# Patient Record
Sex: Male | Born: 1965
Health system: Southern US, Community
[De-identification: ages and names within clinical notes are randomized; demographics above are authoritative.]

## PROBLEM LIST (undated history)

## (undated) DIAGNOSIS — N529 Male erectile dysfunction, unspecified: Secondary | ICD-10-CM

## (undated) DIAGNOSIS — N400 Enlarged prostate without lower urinary tract symptoms: Secondary | ICD-10-CM

## (undated) DIAGNOSIS — E785 Hyperlipidemia, unspecified: Secondary | ICD-10-CM

## (undated) DIAGNOSIS — G4730 Sleep apnea, unspecified: Secondary | ICD-10-CM

## (undated) DIAGNOSIS — T7840XA Allergy, unspecified, initial encounter: Secondary | ICD-10-CM

## (undated) HISTORY — PX: VASECTOMY: SHX75

## (undated) HISTORY — DX: Benign prostatic hyperplasia without lower urinary tract symptoms: N40.0

## (undated) HISTORY — DX: Allergy, unspecified, initial encounter: T78.40XA

---

## 2006-08-16 ENCOUNTER — Ambulatory Visit: Payer: Self-pay | Admitting: Internal Medicine

## 2012-04-14 ENCOUNTER — Emergency Department: Payer: Self-pay | Admitting: Unknown Physician Specialty

## 2012-08-05 ENCOUNTER — Emergency Department: Payer: Self-pay | Admitting: Emergency Medicine

## 2012-08-05 LAB — CBC
HCT: 45.2 % (ref 40.0–52.0)
HGB: 15.7 g/dL (ref 13.0–18.0)
MCV: 92 fL (ref 80–100)
RBC: 4.94 10*6/uL (ref 4.40–5.90)
RDW: 12.7 % (ref 11.5–14.5)
WBC: 6.5 10*3/uL (ref 3.8–10.6)

## 2012-08-05 LAB — COMPREHENSIVE METABOLIC PANEL
Albumin: 4 g/dL (ref 3.4–5.0)
Alkaline Phosphatase: 64 U/L (ref 50–136)
Anion Gap: 8 (ref 7–16)
BUN: 12 mg/dL (ref 7–18)
Calcium, Total: 8.7 mg/dL (ref 8.5–10.1)
Glucose: 86 mg/dL (ref 65–99)
Osmolality: 278 (ref 275–301)
Potassium: 3.8 mmol/L (ref 3.5–5.1)
SGOT(AST): 39 U/L — ABNORMAL HIGH (ref 15–37)
Sodium: 140 mmol/L (ref 136–145)
Total Protein: 7.1 g/dL (ref 6.4–8.2)

## 2012-08-05 LAB — TROPONIN I: Troponin-I: <0.02 ng/mL

## 2012-08-05 LAB — CK TOTAL AND CKMB (NOT AT ARMC)
CK, Total: 565 U/L — ABNORMAL HIGH (ref 35–232)
CK-MB: 2.5 ng/mL (ref 0.5–3.6)

## 2012-09-17 ENCOUNTER — Emergency Department: Payer: Self-pay | Admitting: Emergency Medicine

## 2016-04-26 DIAGNOSIS — N400 Enlarged prostate without lower urinary tract symptoms: Secondary | ICD-10-CM | POA: Diagnosis not present

## 2016-12-02 DIAGNOSIS — J301 Allergic rhinitis due to pollen: Secondary | ICD-10-CM | POA: Diagnosis not present

## 2016-12-02 DIAGNOSIS — K1121 Acute sialoadenitis: Secondary | ICD-10-CM | POA: Diagnosis not present

## 2017-01-10 DIAGNOSIS — N401 Enlarged prostate with lower urinary tract symptoms: Secondary | ICD-10-CM | POA: Diagnosis not present

## 2017-01-10 DIAGNOSIS — I119 Hypertensive heart disease without heart failure: Secondary | ICD-10-CM | POA: Diagnosis not present

## 2017-01-10 DIAGNOSIS — E782 Mixed hyperlipidemia: Secondary | ICD-10-CM | POA: Diagnosis not present

## 2017-01-10 DIAGNOSIS — N400 Enlarged prostate without lower urinary tract symptoms: Secondary | ICD-10-CM | POA: Diagnosis not present

## 2017-08-11 ENCOUNTER — Ambulatory Visit: Payer: BLUE CROSS/BLUE SHIELD | Admitting: Physician Assistant

## 2017-08-11 ENCOUNTER — Other Ambulatory Visit: Payer: Self-pay

## 2017-08-11 ENCOUNTER — Encounter: Payer: Self-pay | Admitting: Physician Assistant

## 2017-08-11 VITALS — BP 108/74 | HR 78 | Temp 98.3°F | Resp 12 | Ht 69.0 in | Wt 204.2 lb

## 2017-08-11 DIAGNOSIS — Z23 Encounter for immunization: Secondary | ICD-10-CM

## 2017-08-11 DIAGNOSIS — Z1211 Encounter for screening for malignant neoplasm of colon: Secondary | ICD-10-CM

## 2017-08-11 DIAGNOSIS — Z125 Encounter for screening for malignant neoplasm of prostate: Secondary | ICD-10-CM

## 2017-08-11 DIAGNOSIS — N400 Enlarged prostate without lower urinary tract symptoms: Secondary | ICD-10-CM

## 2017-08-11 DIAGNOSIS — N401 Enlarged prostate with lower urinary tract symptoms: Secondary | ICD-10-CM | POA: Diagnosis not present

## 2017-08-11 DIAGNOSIS — Z7689 Persons encountering health services in other specified circumstances: Secondary | ICD-10-CM | POA: Diagnosis not present

## 2017-08-11 NOTE — Patient Instructions (Signed)

## 2017-08-11 NOTE — Progress Notes (Signed)
Patient: Travis Mahoney, Male    DOB: 1966-03-09, 51 y.o.   MRN: [id number] Visit Date: 08/11/2017  Today's Provider: Trinna Post, PA-C   Chief Complaint  Patient presents with  . Establish Care   Subjective:  Travis Mahoney is a 51 y.o. male who presents today to get established with practice and his physical exam.  His previous PCP was Dr Soyla Dryer and he has retired.  Patient does not have any issues or concerns just wants to get a check up. Patient states that if he gets flu shot he will get this at his work because it is free there. Cannot remember the last time he had a tetanus shot.   He works as a Banker at [employer], works many long hours. He is single. He has a 45 year old daughter who is doing well.   He is sexually active, does not have a concern for an STI.   He has never been screened for colon cancer. He does not have a family history of colon cancer. He would like to be referred for colonoscopy. He has never been screened for prostate cancer to his knowledge. There is history of prostate cancer in a grandfather. Patient himself has an enlarged prostate and takes flomax and Cialis for this.   He does not and has never smoked. Will have a few beers a week. Does not and has never used drugs.   Otherwise, he reports no health issues.   Review of Systems  Constitutional: Negative.   HENT: Negative.   Eyes: Negative.   Respiratory: Negative.   Cardiovascular: Negative.   Gastrointestinal: Negative.   Endocrine: Negative.   Musculoskeletal: Negative.   Skin: Negative.   Neurological: Negative.   Psychiatric/Behavioral: Negative.     Social History   Socioeconomic History  . Marital status: Single    Spouse name: Not on file  . Number of children: Not on file  . Years of education: Not on file  . Highest education level: Not on file  Social Needs  . Financial resource strain: Not on file  . Food insecurity - worry: Not on file  . Food insecurity -  inability: Not on file  . Transportation needs - medical: Not on file  . Transportation needs - non-medical: Not on file  Occupational History  . Not on file  Tobacco Use  . Smoking status: Never Smoker  . Smokeless tobacco: Never Used  Substance and Sexual Activity  . Alcohol use: Yes    Alcohol/week: 2.4 oz    Types: 4 Cans of beer per week  . Drug use: No  . Sexual activity: Not on file  Other Topics Concern  . Not on file  Social History Narrative  . Not on file    There are no active problems to display for this patient.   Past Surgical History:  Procedure Laterality Date  . VASECTOMY      His family history includes Diabetes in his father; Hypertension in his mother; Other in his father.     Outpatient Encounter Medications as of 08/11/2017  Medication Sig  . CIALIS 5 MG tablet TK 1 T PO QD  . tamsulosin (FLOMAX) 0.4 MG CAPS capsule    No facility-administered encounter medications on file as of 08/11/2017.     Patient Care Team: Paulene Floor as PCP - General (Physician Assistant)      Objective:   Vitals:  Vitals:   08/11/17 1408  BP:  108/74  Pulse: 78  Resp: 12  Temp: 98.3 F (36.8 C)  Weight: 204 lb 3.2 oz (92.6 kg)  Height: 5\' 9"  (1.753 m)    Physical Exam  Constitutional: He is oriented to person, place, and time. He appears well-developed and well-nourished.  HENT:  Right Ear: Tympanic membrane and external ear normal.  Left Ear: Tympanic membrane and external ear normal.  Mouth/Throat: No oropharyngeal exudate.  Eyes: Conjunctivae are normal.  Neck: Neck supple.  Cardiovascular: Normal rate and regular rhythm.  Pulmonary/Chest: Effort normal and breath sounds normal.  Abdominal: Soft. Bowel sounds are normal. He exhibits no distension. There is no tenderness. There is no rebound.  Lymphadenopathy:    He has no cervical adenopathy.  Neurological: He is alert and oriented to person, place, and time.  Skin: Skin is warm and dry.   Psychiatric: He has a normal mood and affect. His behavior is normal.     Depression Screen PHQ 2/9 Scores 08/11/2017  PHQ - 2 Score 0  PHQ- 9 Score 0   Assessment & Plan:    1. Encounter to establish care Records requested from Dr Stephens November office. Will have him get follow up lab work after requesting these since he said he recently got labs.  2. Colon cancer screening - Ambulatory referral to Gastroenterology  3. Prostate cancer screening  4. Enlarged prostate  Will refill flomax and cialis.   5. Benign prostatic hyperplasia with lower urinary tract symptoms, symptom details unspecified   6. Need for Tdap vaccination Administered today. - Tdap vaccine greater than or equal to 7yo IM  Return in about 1 year (around 08/11/2018) for CPE.  The entirety of the information documented in the History of Present Illness, Review of Systems and Physical Exam were personally obtained by me. Portions of this information were initially documented by [facility], CMA and reviewed by me for thoroughness and accuracy.

## 2017-08-11 NOTE — Progress Notes (Deleted)
   Travis Mahoney  MRN: [id number] DOB: December 11, 1965  Subjective:  HPI  Patient is here to get established. His previous PCP was Dr Soyla Dryer and he has retired. Patient does not have any issues or concerns just wants to get a check up.  There are no active problems to display for this patient.   Past Medical History:  Diagnosis Date  . Allergy   . Enlarged prostate     Social History   Socioeconomic History  . Marital status: Married    Spouse name: Not on file  . Number of children: Not on file  . Years of education: Not on file  . Highest education level: Not on file  Social Needs  . Financial resource strain: Not on file  . Food insecurity - worry: Not on file  . Food insecurity - inability: Not on file  . Transportation needs - medical: Not on file  . Transportation needs - non-medical: Not on file  Occupational History  . Not on file  Tobacco Use  . Smoking status: Never Smoker  . Smokeless tobacco: Never Used  Substance and Sexual Activity  . Alcohol use: Yes    Alcohol/week: 2.4 oz    Types: 4 Cans of beer per week  . Drug use: No  . Sexual activity: Not on file  Other Topics Concern  . Not on file  Social History Narrative  . Not on file    Outpatient Encounter Medications as of 08/11/2017  Medication Sig  . CIALIS 5 MG tablet TK 1 T PO QD  . tamsulosin (FLOMAX) 0.4 MG CAPS capsule    No facility-administered encounter medications on file as of 08/11/2017.     Allergies  Allergen Reactions  . Penicillins Itching and Swelling    ROS  Objective:  BP 108/74   Pulse 78   Temp 98.3 F (36.8 C)   Resp 12   Ht 5\' 9"  (1.753 m)   Wt 204 lb 3.2 oz (92.6 kg)   BMI 30.16 kg/m   Physical Exam  Assessment and Plan :  No diagnosis found.

## 2017-08-31 ENCOUNTER — Ambulatory Visit: Payer: BLUE CROSS/BLUE SHIELD | Admitting: Physician Assistant

## 2017-08-31 ENCOUNTER — Encounter: Payer: Self-pay | Admitting: Physician Assistant

## 2017-08-31 VITALS — BP 108/64 | HR 88 | Temp 99.1°F | Resp 16 | Wt 210.0 lb

## 2017-08-31 DIAGNOSIS — Z1211 Encounter for screening for malignant neoplasm of colon: Secondary | ICD-10-CM

## 2017-08-31 DIAGNOSIS — R29818 Other symptoms and signs involving the nervous system: Secondary | ICD-10-CM

## 2017-08-31 DIAGNOSIS — R1032 Left lower quadrant pain: Secondary | ICD-10-CM

## 2017-08-31 NOTE — Patient Instructions (Signed)
Hernia A hernia happens when an organ or tissue inside your body pushes out through a weak spot in the belly (abdomen). Follow these instructions at home:  Avoid stretching or overusing (straining) the muscles near the hernia.  Do not lift anything heavier than 10 lb (4.5 kg).  Use the muscles in your leg when you lift something up. Do not use the muscles in your back.  When you cough, try to cough gently.  Eat a diet that has a lot of fiber. Eat lots of fruits and vegetables.  Drink enough fluids to keep your pee (urine) clear or pale yellow. Try to drink 6-8 glasses of water a day.  Take medicines to make your poop soft (stool softeners) as told by your doctor.  Lose weight, if you are overweight.  Do not use any tobacco products, including cigarettes, chewing tobacco, or electronic cigarettes. If you need help quitting, ask your doctor.  Keep all follow-up visits as told by your doctor. This is important. Contact a doctor if:  The skin by the hernia gets puffy (swollen) or red.  The hernia is painful. Get help right away if:  You have a fever.  You have belly pain that is getting worse.  You feel sick to your stomach (nauseous) or you throw up (vomit).  You cannot push the hernia back in place by gently pressing on it while you are lying down.  The hernia: ? Changes in shape or size. ? Is stuck outside your belly. ? Changes color. ? Feels hard or tender. This information is not intended to replace advice given to you by your health care provider. Make sure you discuss any questions you have with your health care provider. Document Released: 03/09/2010 Document Revised: 02/25/2016 Document Reviewed: 07/30/2014 Elsevier Interactive Patient Education  2018 Elsevier Inc.  

## 2017-08-31 NOTE — Progress Notes (Signed)
Patient: Travis Mahoney Male    DOB: 02/28/1966   51 y.o.   MRN: [id number] Visit Date: 08/31/2017  Today's Provider: Trinna Post, PA-C   Chief Complaint  Patient presents with  . Abdominal Pain    Left side; has been going on for months.    . Sleep Apnea   Subjective:    Travis Mahoney is a 51 y/o man who presents with LLQ abdominal pain ongoing for several months and also concern for sleep apnea.   Reports a dull pain in his abdomen that increases with weight lifting activities and abdominal exercises. Sometimes, he notices a protrusion from this area. Pertinent info below.  Abdominal Pain  This is a chronic problem. The current episode started more than 1 month ago. The pain is located in the LLQ. The quality of the pain is dull. The abdominal pain does not radiate. Associated symptoms include constipation. Pertinent negatives include no anorexia, arthralgias, belching, diarrhea, dysuria, fever, flatus, frequency, headaches, hematochezia, hematuria, melena, myalgias, nausea, vomiting or weight loss. Exacerbated by: abdominal exercises. The pain is relieved by nothing.    Sleep Apnea: Patient presents with possible obstructive sleep apnea. Patent has a 1 year history of symptoms of daytime fatigue and morning fatigue. Patient generally gets 4 hours of sleep per night, and states they generally have difficulty falling asleep, nightime awakenings and difficulty falling back asleep if awakened. Snoring of mild severity is present. Apneic episodes is present. Nasal obstruction is not present.  Patient has not had tonsillectomy.      Allergies  Allergen Reactions  . Penicillins Itching and Swelling     Current Outpatient Medications:  .  CIALIS 5 MG tablet, TK 1 T PO QD, Disp: , Rfl: 3 .  tamsulosin (FLOMAX) 0.4 MG CAPS capsule, , Disp: , Rfl: 0  Review of Systems  Constitutional: Positive for fatigue. Negative for activity change, appetite change, chills,  diaphoresis, fever, unexpected weight change and weight loss.  HENT: Negative for congestion.   Respiratory: Positive for apnea. Negative for cough, choking, chest tightness, shortness of breath, wheezing and stridor.   Cardiovascular: Negative.   Gastrointestinal: Positive for abdominal pain and constipation. Negative for abdominal distention, anal bleeding, anorexia, blood in stool, diarrhea, flatus, hematochezia, melena, nausea, rectal pain and vomiting.  Genitourinary: Negative for dysuria, frequency and hematuria.  Musculoskeletal: Negative for arthralgias and myalgias.  Neurological: Negative for headaches.    Social History   Tobacco Use  . Smoking status: Never Smoker  . Smokeless tobacco: Never Used  Substance Use Topics  . Alcohol use: Yes    Alcohol/week: 2.4 oz    Types: 4 Cans of beer per week   Objective:   BP 108/64 (BP Location: Left Arm, Patient Position: Sitting, Cuff Size: Large)   Pulse 88   Temp 99.1 F (37.3 C) (Oral)   Resp 16   Wt 210 lb (95.3 kg)   BMI 31.01 kg/m  Vitals:   08/31/17 0817  BP: 108/64  Pulse: 88  Resp: 16  Temp: 99.1 F (37.3 C)  TempSrc: Oral  Weight: 210 lb (95.3 kg)     Physical Exam  Constitutional: He is oriented to person, place, and time. He appears well-developed and well-nourished.  Cardiovascular: Normal rate and regular rhythm.  Pulmonary/Chest: Effort normal.  Abdominal: Soft. Bowel sounds are normal. He exhibits no distension and no mass. There is no tenderness. There is no rebound and no guarding.  No hernia noted on provocative maneuvers.   Neurological: He is alert and oriented to person, place, and time.  Skin: Skin is warm and dry.  Psychiatric: He has a normal mood and affect. His behavior is normal.        Assessment & Plan:     1. Left lower quadrant pain  Suspect hernia, will refer to surgery for further work up. Counseled on avoiding heavy lifting and abdominal exercises, cautioned on  strangulation.   - Ambulatory referral to General Surgery  2. Suspected sleep apnea  - Home sleep test; Future  3. Colon cancer screening  Reordered, for some reason he was not contacted.   - Ambulatory referral to Gastroenterology  Return if symptoms worsen or fail to improve.  The entirety of the information documented in the History of Present Illness, Review of Systems and Physical Exam were personally obtained by me. Portions of this information were initially documented by Ashley Royalty, CMA and reviewed by me for thoroughness and accuracy.         Trinna Post, PA-C  [facility]

## 2017-09-14 ENCOUNTER — Telehealth: Payer: Self-pay | Admitting: Physician Assistant

## 2017-09-14 NOTE — Telephone Encounter (Signed)
Order for home sleep study faxed to [facility]

## 2017-09-18 DIAGNOSIS — G4733 Obstructive sleep apnea (adult) (pediatric): Secondary | ICD-10-CM | POA: Diagnosis not present

## 2017-09-18 DIAGNOSIS — R0602 Shortness of breath: Secondary | ICD-10-CM | POA: Diagnosis not present

## 2017-09-19 DIAGNOSIS — R0602 Shortness of breath: Secondary | ICD-10-CM | POA: Diagnosis not present

## 2017-09-19 DIAGNOSIS — G4733 Obstructive sleep apnea (adult) (pediatric): Secondary | ICD-10-CM | POA: Diagnosis not present

## 2017-09-28 ENCOUNTER — Telehealth: Payer: Self-pay

## 2017-09-28 ENCOUNTER — Ambulatory Visit: Payer: BLUE CROSS/BLUE SHIELD | Admitting: Surgery

## 2017-09-28 ENCOUNTER — Encounter: Payer: Self-pay | Admitting: Surgery

## 2017-09-28 DIAGNOSIS — R1084 Generalized abdominal pain: Secondary | ICD-10-CM | POA: Diagnosis not present

## 2017-09-28 NOTE — Patient Instructions (Addendum)
We have scheduled you for a CT Scan of your Abdomen and Pelvis. This has been scheduled at 2:15 at our [facility] location. Please Check-in at 2:00, 15 minutes prior to your scheduled appointment. If you need to reschedule your Scan, you may do so by calling [phone].  You will need to pick up a prep kit at least 24 hours in advance of your Scan: You may pick this up at the [place] department at [facility], or [employer].  Bring a list of medications with you to your appointment and you may have nothing to eat or drink 4 hours prior to your CT Scan.    [facility] [address] [place], [place], [place] [address] Phone: [phone]   We will see you back in office as listed below:  However if you need to be seen sooner or have any questions please give our office a call.

## 2017-09-28 NOTE — Telephone Encounter (Signed)
Call made to [facility]

## 2017-09-29 ENCOUNTER — Encounter: Payer: Self-pay | Admitting: Surgery

## 2017-09-29 NOTE — Progress Notes (Signed)
Surgical Consultation  09/29/2017  Travis Mahoney is an 51 y.o. male.   Chief Complaint  Patient presents with  . New Patient (Initial Visit)    Left lower quadrant pain referred by [name]     HPI: 51 yo seen in consultation at the request of Mrs. Terrilee Croak PA. Patient reports that over the last couple months he has been having some intermittent dull discomfort/pain on the left lower quadrant. He is very active and did a lot of sit-ups and pushups. He goes to the gym regularly and is very fit. He reports that started having some left lower quadrant discomfort and usually gets worse with some exercise and certain movements. He does notice some occasional bulging of his abdominal wall. No fevers no chills, no weight loss. No nausea or vomiting. Is able to perform more than 6 Mets of activity without any shortness of breath or chest pain.  Past Medical History:  Diagnosis Date  . Allergy   . Enlarged prostate     Past Surgical History:  Procedure Laterality Date  . VASECTOMY      Family History  Problem Relation Age of Onset  . Other Father        enlarged prostate  . Diabetes Father   . Hypertension Mother     Social History:  reports that  has never smoked. he has never used smokeless tobacco. He reports that he drinks about 2.4 oz of alcohol per week. He reports that he does not use drugs.  Allergies:  Allergies  Allergen Reactions  . Penicillins Itching and Swelling    Medications reviewed.     ROS Full ROS performed and is otherwise negative other than what is stated in the HPI    BP 124/79   Pulse 78   Temp 98.2 F (36.8 C) (Oral)   Ht 5\' 9"  (1.753 m)   Wt 94.8 kg (209 lb)   BMI 30.86 kg/m    Physical Exam  Constitutional: He is oriented to person, place, and time and well-developed, well-nourished, and in no distress. No distress.  Eyes: Right eye exhibits no discharge. Left eye exhibits no discharge. No scleral icterus.  Neck: Normal range  of motion. Neck supple. No JVD present.  Cardiovascular: Normal rate, regular rhythm, normal heart sounds and intact distal pulses.  No murmur heard. Pulmonary/Chest: Effort normal. No stridor. No respiratory distress. He has no wheezes. He has no rales.  Abdominal: Soft. He exhibits no distension and no mass. There is no tenderness. There is no rebound and no guarding.  Neurological: He is alert and oriented to person, place, and time. No cranial nerve deficit. Gait normal. GCS score is 15.  Skin: Skin is warm and dry. He is not diaphoretic.  Psychiatric: Mood, memory, affect and judgment normal.  Nursing note and vitals reviewed.   Assessment/Plan: Left lower quadrant pain although no definitive evidence of hernia on physical examination. To exclude any potential (or inguinal hernia I will order a CT scan of the abdomen and pelvis. Certainly another possibility will be a sports hernia. School skeletal pain due to the muscle injury or tendon might also be in the differential diagnosis Discussed with the patient in detail for now we'll recommend anti-inflammatories as well as eyes. Avoiding strenuous activities. 1. Generalized abdominal pain We will order - CBC with Differential; Future - Comprehensive metabolic panel; Future - CT Abdomen Pelvis W Contrast; Future A copy of the report will be sent to the referring provider  Caroleen Hamman, MD [facility] General Surgeon

## 2017-10-06 ENCOUNTER — Other Ambulatory Visit: Payer: Self-pay

## 2017-10-06 DIAGNOSIS — Z1211 Encounter for screening for malignant neoplasm of colon: Secondary | ICD-10-CM

## 2017-10-06 NOTE — Telephone Encounter (Signed)
Gastroenterology Pre-Procedure Review  Request Date:   Requesting Physician: Dr.    PATIENT REVIEW QUESTIONS: The patient responded to the following health history questions as indicated:    1. Are you having any GI issues? no 2. Do you have a personal history of Polyps? no 3. Do you have a family history of Colon Cancer or Polyps? no 4. Diabetes Mellitus? no 5. Joint replacements in the past 12 months?no 6. Major health problems in the past 3 months?no 7. Any artificial heart valves, MVP, or defibrillator?no    MEDICATIONS & ALLERGIES:    Patient reports the following regarding taking any anticoagulation/antiplatelet therapy:   Plavix, Coumadin, Eliquis, Xarelto, Lovenox, Pradaxa, Brilinta, or Effient? no Aspirin? no  Patient confirms/reports the following medications:  Current Outpatient Medications  Medication Sig Dispense Refill   CIALIS 5 MG tablet TK 1 T PO QD  3   tamsulosin (FLOMAX) 0.4 MG CAPS capsule   0   No current facility-administered medications for this visit.     Patient confirms/reports the following allergies:  Allergies  Allergen Reactions   Penicillins Itching and Swelling    No orders of the defined types were placed in this encounter.   AUTHORIZATION INFORMATION Primary Insurance: 1D#: Group #:  Secondary Insurance: 1D#: Group #:  SCHEDULE INFORMATION: Date:  Time: Location:

## 2017-10-09 ENCOUNTER — Ambulatory Visit: Payer: Self-pay

## 2017-10-09 ENCOUNTER — Ambulatory Visit
Admission: RE | Admit: 2017-10-09 | Discharge: 2017-10-09 | Disposition: A | Discharge status: Home / Self Care | Payer: BLUE CROSS/BLUE SHIELD | Source: Ambulatory Visit | Attending: Surgery | Admitting: Surgery

## 2017-10-09 ENCOUNTER — Encounter: Payer: Self-pay | Admitting: Physician Assistant

## 2017-10-09 ENCOUNTER — Telehealth: Payer: Self-pay | Admitting: Gastroenterology

## 2017-10-09 DIAGNOSIS — R1084 Generalized abdominal pain: Secondary | ICD-10-CM

## 2017-10-09 DIAGNOSIS — R1032 Left lower quadrant pain: Secondary | ICD-10-CM | POA: Diagnosis not present

## 2017-10-09 DIAGNOSIS — R109 Unspecified abdominal pain: Secondary | ICD-10-CM | POA: Diagnosis not present

## 2017-10-09 MED ORDER — IOPAMIDOL (ISOVUE-300) INJECTION 61%
100.0000 mL | Freq: Once | INTRAVENOUS | Status: AC | PRN
  Administered 2017-10-09: 100 mL via INTRAVENOUS

## 2017-10-09 NOTE — Telephone Encounter (Signed)
Gastroenterology Pre-Procedure Review  Request Date:   Requesting Physician: Dr.    PATIENT REVIEW QUESTIONS: The patient responded to the following health history questions as indicated:    1. Are you having any GI issues? no 2. Do you have a personal history of Polyps? no 3. Do you have a family history of Colon Cancer or Polyps? no 4. Diabetes Mellitus? no 5. Joint replacements in the past 12 months?no 6. Major health problems in the past 3 months?no 7. Any artificial heart valves, MVP, or defibrillator?no    MEDICATIONS & ALLERGIES:    Patient reports the following regarding taking any anticoagulation/antiplatelet therapy:   Plavix, Coumadin, Eliquis, Xarelto, Lovenox, Pradaxa, Brilinta, or Effient? no Aspirin? no  Patient confirms/reports the following medications:  Current Outpatient Medications  Medication Sig Dispense Refill   CIALIS 5 MG tablet TK 1 T PO QD  3   tamsulosin (FLOMAX) 0.4 MG CAPS capsule   0   No current facility-administered medications for this visit.     Patient confirms/reports the following allergies:  Allergies  Allergen Reactions   Penicillins Itching and Swelling    No orders of the defined types were placed in this encounter.   AUTHORIZATION INFORMATION Primary Insurance: 1D#: Group #:  Secondary Insurance: 1D#: Group #:  SCHEDULE INFORMATION: Date:  Time: Location:

## 2017-10-12 DIAGNOSIS — G4733 Obstructive sleep apnea (adult) (pediatric): Secondary | ICD-10-CM | POA: Diagnosis not present

## 2017-10-13 ENCOUNTER — Telehealth: Payer: Self-pay | Admitting: Gastroenterology

## 2017-10-13 ENCOUNTER — Telehealth: Payer: Self-pay

## 2017-10-13 NOTE — Telephone Encounter (Signed)
-----   Message from Jules Husbands, MD sent at 10/09/2017  7:27 PM EST ----- Please let the pt know that there were no hernias on CT scan, ? Bladder infection but this is not definitive. May keep f/u appt

## 2017-10-13 NOTE — Telephone Encounter (Signed)
Dr. Dahlia Byes wanted for me to contact patient to let him know that his CT Scan did not show any hernias but to continue with his follow up appointment with him. Patient agreed and had no questions at this time. Patient also has his colonoscopy scheduled on 10/20/2017 with Dr. Vicente Males.

## 2017-10-13 NOTE — Telephone Encounter (Signed)
Gastroenterology Pre-Procedure Review  Request Date:   Requesting Physician: Dr.    PATIENT REVIEW QUESTIONS: The patient responded to the following health history questions as indicated:    1. Are you having any GI issues? no 2. Do you have a personal history of Polyps? no 3. Do you have a family history of Colon Cancer or Polyps? no 4. Diabetes Mellitus? no 5. Joint replacements in the past 12 months?no 6. Major health problems in the past 3 months?no 7. Any artificial heart valves, MVP, or defibrillator?no    MEDICATIONS & ALLERGIES:    Patient reports the following regarding taking any anticoagulation/antiplatelet therapy:   Plavix, Coumadin, Eliquis, Xarelto, Lovenox, Pradaxa, Brilinta, or Effient? no Aspirin? no  Patient confirms/reports the following medications:  Current Outpatient Medications  Medication Sig Dispense Refill   CIALIS 5 MG tablet TK 1 T PO QD  3   tamsulosin (FLOMAX) 0.4 MG CAPS capsule   0   No current facility-administered medications for this visit.     Patient confirms/reports the following allergies:  Allergies  Allergen Reactions   Penicillins Itching and Swelling    No orders of the defined types were placed in this encounter.   AUTHORIZATION INFORMATION Primary Insurance: 1D#: Group #:  Secondary Insurance: 1D#: Group #:  SCHEDULE INFORMATION: Date:  Time: Location:

## 2017-10-18 ENCOUNTER — Encounter: Payer: Self-pay | Admitting: Surgery

## 2017-10-18 ENCOUNTER — Ambulatory Visit: Payer: BLUE CROSS/BLUE SHIELD | Admitting: Surgery

## 2017-10-18 VITALS — BP 118/83 | HR 95 | Temp 98.7°F | Ht 69.0 in | Wt 201.2 lb

## 2017-10-18 DIAGNOSIS — R1030 Lower abdominal pain, unspecified: Secondary | ICD-10-CM

## 2017-10-18 NOTE — Patient Instructions (Signed)
Please call our office with any questions or concerns. 

## 2017-10-18 NOTE — Progress Notes (Signed)
Outpatient Surgical Follow Up  10/18/2017  Travis Mahoney is an 52 y.o. male.   Chief Complaint  Patient presents with  . Follow-up    LLQpain    HPI: LLQ pain, CT scan personal review there is no evidence of abdominal wall defects or inguinal hernia defects. There is no evidence of any acute intra-abdominal modest. Patient reports that his symptoms have improved significantly. No fevers no chills. Tolerating diet.  Past Medical History:  Diagnosis Date  . Allergy   . Enlarged prostate     Past Surgical History:  Procedure Laterality Date  . VASECTOMY      Family History  Problem Relation Age of Onset  . Other Father        enlarged prostate  . Diabetes Father   . Hypertension Mother     Social History:  reports that  has never smoked. he has never used smokeless tobacco. He reports that he drinks about 2.4 oz of alcohol per week. He reports that he does not use drugs.  Allergies:  Allergies  Allergen Reactions  . Penicillins Itching and Swelling    Medications reviewed.    ROS Full ROS performed and is otherwise negative other than what is stated in HPI   BP 118/83   Pulse 95   Temp 98.7 F (37.1 C) (Oral)   Ht 5\' 9"  (1.753 m)   Wt 91.3 kg (201 lb 3.2 oz)   BMI 29.71 kg/m    Physical Exam  Constitutional: He is oriented to person, place, and time and well-developed, well-nourished, and in no distress. No distress.  Eyes: No scleral icterus.  Neck: No JVD present.  Pulmonary/Chest: Effort normal. No stridor. No respiratory distress.  Abdominal: Soft. He exhibits no distension. There is no tenderness. There is no rebound and no guarding.  Musculoskeletal: Normal range of motion.  Neurological: He is alert and oriented to person, place, and time. Gait normal. GCS score is 15.  Skin: Skin is warm and dry.  Psychiatric: Mood, memory, affect and judgment normal.  Nursing note and vitals reviewed.   Assessment/Plan: Abdominal pain resolving , finding  was closed in origin. No need for any surgical intervention. Follow-up when necessary  Caroleen Hamman, MD [facility] General Surgeon

## 2017-10-19 ENCOUNTER — Encounter: Payer: Self-pay | Admitting: *Deleted

## 2017-10-20 ENCOUNTER — Encounter
Admission: RE | Disposition: A | Discharge status: Home / Self Care | Payer: Self-pay | Source: Ambulatory Visit | Attending: Gastroenterology

## 2017-10-20 ENCOUNTER — Other Ambulatory Visit: Payer: Self-pay

## 2017-10-20 ENCOUNTER — Ambulatory Visit
Admission: RE | Admit: 2017-10-20 | Discharge: 2017-10-20 | Disposition: A | Discharge status: Home / Self Care | Payer: BLUE CROSS/BLUE SHIELD | Source: Ambulatory Visit | Attending: Gastroenterology | Admitting: Gastroenterology

## 2017-10-20 ENCOUNTER — Encounter: Payer: Self-pay | Admitting: Student

## 2017-10-20 ENCOUNTER — Ambulatory Visit: Payer: BLUE CROSS/BLUE SHIELD | Admitting: Certified Registered"

## 2017-10-20 DIAGNOSIS — Z1211 Encounter for screening for malignant neoplasm of colon: Secondary | ICD-10-CM | POA: Insufficient documentation

## 2017-10-20 DIAGNOSIS — N529 Male erectile dysfunction, unspecified: Secondary | ICD-10-CM | POA: Diagnosis not present

## 2017-10-20 DIAGNOSIS — G4730 Sleep apnea, unspecified: Secondary | ICD-10-CM | POA: Diagnosis not present

## 2017-10-20 DIAGNOSIS — D123 Benign neoplasm of transverse colon: Secondary | ICD-10-CM | POA: Diagnosis not present

## 2017-10-20 DIAGNOSIS — Z9989 Dependence on other enabling machines and devices: Secondary | ICD-10-CM | POA: Diagnosis not present

## 2017-10-20 DIAGNOSIS — N400 Enlarged prostate without lower urinary tract symptoms: Secondary | ICD-10-CM | POA: Diagnosis not present

## 2017-10-20 DIAGNOSIS — K635 Polyp of colon: Secondary | ICD-10-CM | POA: Diagnosis not present

## 2017-10-20 DIAGNOSIS — K621 Rectal polyp: Secondary | ICD-10-CM | POA: Diagnosis not present

## 2017-10-20 DIAGNOSIS — K6289 Other specified diseases of anus and rectum: Secondary | ICD-10-CM | POA: Insufficient documentation

## 2017-10-20 HISTORY — DX: Sleep apnea, unspecified: G47.30

## 2017-10-20 HISTORY — PX: COLONOSCOPY WITH PROPOFOL: SHX5780

## 2017-10-20 HISTORY — DX: Male erectile dysfunction, unspecified: N52.9

## 2017-10-20 SURGERY — COLONOSCOPY WITH PROPOFOL
Anesthesia: General

## 2017-10-20 MED ORDER — LIDOCAINE 2% (20 MG/ML) 5 ML SYRINGE
INTRAMUSCULAR | Status: DC | PRN
  Administered 2017-10-20: 25 mg via INTRAVENOUS

## 2017-10-20 MED ORDER — PROPOFOL 10 MG/ML IV BOLUS
INTRAVENOUS | Status: DC | PRN
  Administered 2017-10-20: 70 mg via INTRAVENOUS

## 2017-10-20 MED ORDER — MIDAZOLAM HCL 5 MG/5ML IJ SOLN
INTRAMUSCULAR | Status: DC | PRN
  Administered 2017-10-20: 2 mg via INTRAVENOUS

## 2017-10-20 MED ORDER — MIDAZOLAM HCL 2 MG/2ML IJ SOLN
INTRAMUSCULAR | Status: AC
  Filled 2017-10-20: qty 2

## 2017-10-20 MED ORDER — PROPOFOL 500 MG/50ML IV EMUL
INTRAVENOUS | Status: AC
  Filled 2017-10-20: qty 50

## 2017-10-20 MED ORDER — PROPOFOL 500 MG/50ML IV EMUL
INTRAVENOUS | Status: DC | PRN
  Administered 2017-10-20: 120 ug/kg/min via INTRAVENOUS

## 2017-10-20 MED ORDER — SODIUM CHLORIDE 0.9 % IV SOLN
INTRAVENOUS | Status: DC | PRN
  Administered 2017-10-20: 10:00:00 via INTRAVENOUS

## 2017-10-20 NOTE — Op Note (Signed)
[facility]astroenterology Patient Name: Travis Mahoney Procedure Date: 10/20/2017 9:16 AM MRN: [id number] Account #: [id number] Date of Birth: 1966-08-05 Admit Type: Outpatient Age: 52 Room: [facility] ENDO ROOM 4 Gender: Male Note Status: Finalized Procedure:            Colonoscopy Indications:          Screening for colorectal malignant neoplasm Providers:            Jonathon Bellows MD, MD Referring MD:         [name] Medicines:            Monitored Anesthesia Care Complications:        No immediate complications. Procedure:            Pre-Anesthesia Assessment:                       - Prior to the procedure, a History and Physical was                        performed, and patient medications, allergies and                        sensitivities were reviewed. The patient's tolerance of                        previous anesthesia was reviewed.                       - The risks and benefits of the procedure and the                        sedation options and risks were discussed with the                        patient. All questions were answered and informed                        consent was obtained.                       - ASA Grade Assessment: II - A patient with mild                        systemic disease.                       After obtaining informed consent, the colonoscope was                        passed under direct vision. Throughout the procedure,                        the patient's blood pressure, pulse, and oxygen                        saturations were monitored continuously. The                        Colonoscope was introduced through the anus and                        advanced to  the the cecum, identified by the                        appendiceal orifice, IC valve and transillumination.                        The colonoscopy was performed with ease. The patient                        tolerated the procedure well. The quality of the bowel                       preparation was good. Findings:      The perianal and digital rectal examinations were normal.      Two sessile polyps were found in the rectum and transverse colon. The       polyps were 4 to 5 mm in size. These polyps were removed with a cold       biopsy forceps. Resection and retrieval were complete.      The exam was otherwise without abnormality on direct and retroflexion       views. Impression:           - Two 4 to 5 mm polyps in the rectum and in the                        transverse colon, removed with a cold biopsy forceps.                        Resected and retrieved.                       - The examination was otherwise normal on direct and                        retroflexion views. Recommendation:       - Discharge patient to home (with escort).                       - Resume previous diet.                       - Continue present medications.                       - Await pathology results.                       - Repeat colonoscopy in 5-10 years for surveillance                        based on pathology results. Procedure Code(s):    --- Professional ---                       [phone], Colonoscopy, flexible; with biopsy, single or                        multiple Diagnosis Code(s):    --- Professional ---                       Z12.11, Encounter for screening for malignant neoplasm  of colon                       K62.1, Rectal polyp                       D12.3, Benign neoplasm of transverse colon (hepatic                        flexure or splenic flexure) CPT copyright 2016 American Medical Association. All rights reserved. The codes documented in this report are preliminary and upon coder review may  be revised to meet current compliance requirements. Jonathon Bellows, MD Jonathon Bellows MD, MD 10/20/2017 10:25:30 AM This report has been signed electronically. Number of Addenda: 0 Note Initiated On: 10/20/2017 9:16 AM Scope Withdrawal  Time: 0 hours 12 minutes 50 seconds  Total Procedure Duration: 0 hours 14 minutes 40 seconds       [facility]

## 2017-10-20 NOTE — Transfer of Care (Signed)
Immediate Anesthesia Transfer of Care Note  Patient: Travis Mahoney  Procedure(s) Performed: COLONOSCOPY WITH PROPOFOL (N/A )  Patient Location: Endoscopy Unit  Anesthesia Type:General  Level of Consciousness: awake  Airway & Oxygen Therapy: Patient Spontanous Breathing and Patient connected to nasal cannula oxygen  Post-op Assessment: Report given to RN and Post -op Vital signs reviewed and stable  Post vital signs: Reviewed  Last Vitals:  Vitals:   10/20/17 0817 10/20/17 1028  BP: 123/82 102/67  Pulse: (!) 103 83  Resp: 16 16  Temp: 36.9 C (!) 36.3 C  SpO2: 99% 99%    Last Pain:  Vitals:   10/20/17 0817  TempSrc: Tympanic         Complications: No apparent anesthesia complications

## 2017-10-20 NOTE — Anesthesia Post-op Follow-up Note (Signed)
Anesthesia QCDR form completed.        

## 2017-10-20 NOTE — Anesthesia Preprocedure Evaluation (Signed)
Anesthesia Evaluation  Patient identified by MRN, date of birth, ID band Patient awake    Reviewed: Allergy & Precautions, H&P , NPO status , Patient's Chart, lab work & pertinent test results  History of Anesthesia Complications Negative for: history of anesthetic complications  Airway Mallampati: II  TM Distance: >3 FB Neck ROM: full    Dental  (+) Chipped   Pulmonary neg shortness of breath, sleep apnea and Continuous Positive Airway Pressure Ventilation ,           Cardiovascular Exercise Tolerance: Good (-) angina(-) Past MI and (-) DOE negative cardio ROS       Neuro/Psych negative neurological ROS  negative psych ROS   GI/Hepatic negative GI ROS, Neg liver ROS, neg GERD  ,  Endo/Other  negative endocrine ROS  Renal/GU negative Renal ROS  negative genitourinary   Musculoskeletal   Abdominal   Peds  Hematology negative hematology ROS (+)   Anesthesia Other Findings Past Medical History: No date: Allergy No date: ED (erectile dysfunction) No date: Enlarged prostate No date: Sleep apnea  Past Surgical History: No date: VASECTOMY  BMI    Body Mass Index:  29.98 kg/m      Reproductive/Obstetrics negative OB ROS                             Anesthesia Physical Anesthesia Plan  ASA: III  Anesthesia Plan: General   Post-op Pain Management:    Induction: Intravenous  PONV Risk Score and Plan: Propofol infusion  Airway Management Planned: Natural Airway and Nasal Cannula  Additional Equipment:   Intra-op Plan:   Post-operative Plan:   Informed Consent: I have reviewed the patients History and Physical, chart, labs and discussed the procedure including the risks, benefits and alternatives for the proposed anesthesia with the patient or authorized representative who has indicated his/her understanding and acceptance.   Dental Advisory Given  Plan Discussed with:  Anesthesiologist, CRNA and Surgeon  Anesthesia Plan Comments: (Patient consented for risks of anesthesia including but not limited to:  - adverse reactions to medications - risk of intubation if required - damage to teeth, lips or other oral mucosa - sore throat or hoarseness - Damage to heart, brain, lungs or loss of life  Patient voiced understanding.)        Anesthesia Quick Evaluation

## 2017-10-20 NOTE — Anesthesia Postprocedure Evaluation (Signed)
Anesthesia Post Note  Patient: Travis Mahoney  Procedure(s) Performed: COLONOSCOPY WITH PROPOFOL (N/A )  Patient location during evaluation: Endoscopy Anesthesia Type: General Level of consciousness: awake and alert Pain management: pain level controlled Vital Signs Assessment: post-procedure vital signs reviewed and stable Respiratory status: spontaneous breathing, nonlabored ventilation, respiratory function stable and patient connected to nasal cannula oxygen Cardiovascular status: blood pressure returned to baseline and stable Postop Assessment: no apparent nausea or vomiting Anesthetic complications: no     Last Vitals:  Vitals:   10/20/17 1052 10/20/17 1102  BP: (!) 121/91 125/84  Pulse: 79 75  Resp: 15 15  Temp:    SpO2: 100% 100%    Last Pain:  Vitals:   10/20/17 0817  TempSrc: Tympanic                 Precious Haws Leydi Winstead

## 2017-10-20 NOTE — H&P (Signed)
     Travis Bellows, MD [address], [place], [place], [place], [address] [address], [place], [place], [place], [address] Phone: [phone]  Fax: [phone]  Primary Care Physician:  Trinna Post, PA-C   Pre-Procedure History & Physical: HPI:  Travis Mahoney is a 52 y.o. male is here for an colonoscopy.   Past Medical History:  Diagnosis Date  . Allergy   . ED (erectile dysfunction)   . Enlarged prostate   . Sleep apnea     Past Surgical History:  Procedure Laterality Date  . VASECTOMY      Prior to Admission medications   Medication Sig Start Date End Date Taking? Authorizing Provider  CIALIS 5 MG tablet TK 1 T PO QD 05/13/17  Yes [provider]  tamsulosin (FLOMAX) 0.4 MG CAPS capsule  07/15/17  Yes [provider]    Allergies as of 10/06/2017 - Review Complete 09/29/2017  Allergen Reaction Noted  . Penicillins Itching and Swelling 08/11/2017    Family History  Problem Relation Age of Onset  . Other Father        enlarged prostate  . Diabetes Father   . Hypertension Mother     Social History   Socioeconomic History  . Marital status: Single    Spouse name: Not on file  . Number of children: Not on file  . Years of education: Not on file  . Highest education level: Not on file  Social Needs  . Financial resource strain: Not on file  . Food insecurity - worry: Not on file  . Food insecurity - inability: Not on file  . Transportation needs - medical: Not on file  . Transportation needs - non-medical: Not on file  Occupational History  . Not on file  Tobacco Use  . Smoking status: Never Smoker  . Smokeless tobacco: Never Used  Substance and Sexual Activity  . Alcohol use: Yes    Alcohol/week: 2.4 oz    Types: 4 Cans of beer per week  . Drug use: No  . Sexual activity: Not on file  Other Topics Concern  . Not on file  Social History Narrative  . Not on file    Review of Systems: See HPI, otherwise negative  ROS  Physical Exam: BP 123/82   Pulse (!) 103   Temp 98.4 F (36.9 C) (Tympanic)   Resp 16   Ht 5\' 9"  (1.753 m)   Wt 203 lb (92.1 kg)   SpO2 99%   BMI 29.98 kg/m  General:   Alert,  pleasant and cooperative in NAD Head:  Normocephalic and atraumatic. Neck:  Supple; no masses or thyromegaly. Lungs:  Clear throughout to auscultation, normal respiratory effort.    Heart:  +S1, +S2, Regular rate and rhythm, No edema. Abdomen:  Soft, nontender and nondistended. Normal bowel sounds, without guarding, and without rebound.   Neurologic:  Alert and  oriented x4;  grossly normal neurologically.  Impression/Plan: Travis Mahoney is here for an colonoscopy to be performed for Screening colonoscopy average risk   Risks, benefits, limitations, and alternatives regarding  colonoscopy have been reviewed with the patient.  Questions have been answered.  All parties agreeable.   Travis Bellows, MD  10/20/2017, 9:12 AM

## 2017-10-20 NOTE — H&P (Signed)
           Jonathon Bellows, MD [address], [place], [place], [place], [address] [address], [place], [place], [place], [address] Phone: [phone]  Fax: [phone]  Primary Care Physician:  Trinna Post, PA-C   Pre-Procedure History & Physical: HPI:  HAMISH BANKS is a 52 y.o. male is here for an colonoscopy.   Past Medical History:  Diagnosis Date  . Allergy   . ED (erectile dysfunction)   . Enlarged prostate   . Sleep apnea     Past Surgical History:  Procedure Laterality Date  . VASECTOMY      Prior to Admission medications   Medication Sig Start Date End Date Taking? Authorizing Provider  CIALIS 5 MG tablet TK 1 T PO QD 05/13/17  Yes [provider]  tamsulosin (FLOMAX) 0.4 MG CAPS capsule  07/15/17  Yes [provider]    Allergies as of 10/06/2017 - Review Complete 09/29/2017  Allergen Reaction Noted  . Penicillins Itching and Swelling 08/11/2017    Family History  Problem Relation Age of Onset  . Other Father        enlarged prostate  . Diabetes Father   . Hypertension Mother     Social History   Socioeconomic History  . Marital status: Single    Spouse name: Not on file  . Number of children: Not on file  . Years of education: Not on file  . Highest education level: Not on file  Social Needs  . Financial resource strain: Not on file  . Food insecurity - worry: Not on file  . Food insecurity - inability: Not on file  . Transportation needs - medical: Not on file  . Transportation needs - non-medical: Not on file  Occupational History  . Not on file  Tobacco Use  . Smoking status: Never Smoker  . Smokeless tobacco: Never Used  Substance and Sexual Activity  . Alcohol use: Yes    Alcohol/week: 2.4 oz    Types: 4 Cans of beer per week  . Drug use: No  . Sexual activity: Not on file  Other Topics Concern  . Not on file  Social History Narrative  . Not on file    Review of Systems: See HPI, otherwise  negative ROS  Physical Exam: BP 123/82   Pulse (!) 103   Temp 98.4 F (36.9 C) (Tympanic)   Resp 16   Ht 5\' 9"  (1.753 m)   Wt 203 lb (92.1 kg)   SpO2 99%   BMI 29.98 kg/m  General:   Alert,  pleasant and cooperative in NAD Head:  Normocephalic and atraumatic. Neck:  Supple; no masses or thyromegaly. Lungs:  Clear throughout to auscultation, normal respiratory effort.    Heart:  +S1, +S2, Regular rate and rhythm, No edema. Abdomen:  Soft, nontender and nondistended. Normal bowel sounds, without guarding, and without rebound.   Neurologic:  Alert and  oriented x4;  grossly normal neurologically.  Impression/Plan: FITZ MATSUO is here for an colonoscopy to be performed for Screening colonoscopy average risk   Risks, benefits, limitations, and alternatives regarding  colonoscopy have been reviewed with the patient.  Questions have been answered.  All parties agreeable.   Jonathon Bellows, MD  10/20/2017, 9:57 AM

## 2017-10-21 ENCOUNTER — Encounter: Payer: Self-pay | Admitting: Gastroenterology

## 2017-10-24 LAB — SURGICAL PATHOLOGY

## 2017-10-25 ENCOUNTER — Encounter: Payer: Self-pay | Admitting: Gastroenterology

## 2017-11-12 DIAGNOSIS — G4733 Obstructive sleep apnea (adult) (pediatric): Secondary | ICD-10-CM | POA: Diagnosis not present

## 2017-11-24 ENCOUNTER — Telehealth: Payer: Self-pay | Admitting: Physician Assistant

## 2017-11-24 DIAGNOSIS — N400 Enlarged prostate without lower urinary tract symptoms: Secondary | ICD-10-CM

## 2017-11-24 MED ORDER — CIALIS 5 MG PO TABS: 5.0000 mg | ORAL_TABLET | Freq: Every day | ORAL | 0 refills | Status: DC

## 2017-11-24 NOTE — Telephone Encounter (Signed)
Filled for 90 days

## 2017-11-24 NOTE — Telephone Encounter (Signed)
Pt contacted office for refill request on the following medications:  CIALIS 5 MG tablet  30 day supply  [facility]  Pt stated that he and [name] discussed her taking over the medication at his last OV but he thought he had more refills and is now asking [name] to send in new Rx. Please advise. Thanks TNP

## 2017-11-24 NOTE — Telephone Encounter (Signed)
Please review. KW 

## 2017-12-10 DIAGNOSIS — G4733 Obstructive sleep apnea (adult) (pediatric): Secondary | ICD-10-CM | POA: Diagnosis not present

## 2017-12-26 ENCOUNTER — Other Ambulatory Visit: Payer: Self-pay | Admitting: Physician Assistant

## 2017-12-26 ENCOUNTER — Telehealth: Payer: Self-pay | Admitting: Physician Assistant

## 2017-12-26 NOTE — Telephone Encounter (Signed)
Patient is requesting refill on his tamsulosin (FLOMAX) 0.4 MG CAPS capsule    [place]

## 2018-01-09 ENCOUNTER — Telehealth: Payer: Self-pay | Admitting: Physician Assistant

## 2018-01-09 NOTE — Telephone Encounter (Signed)
Received 12 pages on 08/15/2018 sent to [facility]

## 2018-01-09 NOTE — Telephone Encounter (Signed)
Faxed ROI to Dr Soyla Dryer on [address] on 11.9.18

## 2018-01-10 DIAGNOSIS — G4733 Obstructive sleep apnea (adult) (pediatric): Secondary | ICD-10-CM | POA: Diagnosis not present

## 2018-02-09 DIAGNOSIS — G4733 Obstructive sleep apnea (adult) (pediatric): Secondary | ICD-10-CM | POA: Diagnosis not present

## 2018-03-01 ENCOUNTER — Telehealth: Payer: Self-pay

## 2018-03-01 NOTE — Telephone Encounter (Signed)
Pt called complaining for back, chest pain and dizziness.  He states his symptoms started as just back pain Saturday (02/24/2018). He now has some chest "Discomfort".  He denies exertional chest pain, shortness of breath, Nausea, vomiting, jaw pain or arm pain. Pt also report dizziness since Monday (02/26/2018).  He states it generally happens with position changes such as lying down, or sitting up.  He has had this happen before and feels the same.  He denies confusion, headache.    I made an appointment for him to come in 11am 03/02/2018.  He agreed that if he experiences any of the symptoms above he will go to the Emergency Room to be evaluated.   Thanks,   -Mickel Baas

## 2018-03-02 ENCOUNTER — Ambulatory Visit: Payer: BLUE CROSS/BLUE SHIELD | Admitting: Physician Assistant

## 2018-03-02 ENCOUNTER — Encounter: Payer: Self-pay | Admitting: Physician Assistant

## 2018-03-02 VITALS — BP 112/68 | HR 76 | Temp 99.0°F | Resp 16 | Wt 201.0 lb

## 2018-03-02 DIAGNOSIS — R079 Chest pain, unspecified: Secondary | ICD-10-CM

## 2018-03-02 DIAGNOSIS — R42 Dizziness and giddiness: Secondary | ICD-10-CM | POA: Diagnosis not present

## 2018-03-02 NOTE — Progress Notes (Signed)
Patient: Travis Mahoney Male    DOB: 1966/09/10   52 y.o.   MRN: [id number] Visit Date: 03/02/2018  Today's Provider: Trinna Post, PA-C   Chief Complaint  Patient presents with  . Chest Pain    Started a few days ago   Subjective:    Travis Mahoney is a 52 y/o man presenting today with symptoms of chest pain and dizziness. Reports dizziness has been happening occasionally, during instances of changing positions, like from lying down to sitting, or during workouts. Notably, he is taking Cialias for ED and flomax for BPH. He reports he has been taking cialis every day as of late. He had one episode of erectile dysfunction and has taken Cialis for that, but he reports it may be more of a mental barrier than physical. He reports he has been taking this regimen for years from his previous PCP. He said he did have some dizziness before but was told he was dehydrated.   Additionally, since Monday he has had a moderate chest pain on the right side of his chest and in his back. It is pretty much constant. Not heaviness, not associated with SOB or vomiting or diaphoresis. No acid reflux, denies anxiety. He does work out frequently and Psychologist, sport and exercise.  Chest Pain   This is a new problem. The current episode started in the past 7 days. The problem occurs constantly. The problem has been unchanged. The quality of the pain is described as pressure and dull. Associated symptoms include dizziness and headaches. Pertinent negatives include no diaphoresis, fever, numbness, palpitations or weakness. The pain is aggravated by nothing. He has tried nothing for the symptoms.  Pertinent negatives for past medical history include no seizures.       Allergies  Allergen Reactions  . Penicillins Itching and Swelling  . Shellfish Allergy      Current Outpatient Medications:  .  tamsulosin (FLOMAX) 0.4 MG CAPS capsule, take 1 capsule by mouth once daily for BPH, Disp: 90 capsule, Rfl: 1 .  CIALIS 5 MG  tablet, Take 1 tablet (5 mg total) by mouth daily for 90 doses., Disp: 90 tablet, Rfl: 0  Review of Systems  Constitutional: Positive for fatigue. Negative for activity change, appetite change, chills, diaphoresis, fever and unexpected weight change.  Respiratory: Negative.   Cardiovascular: Positive for chest pain. Negative for palpitations and leg swelling.  Gastrointestinal: Negative.   Neurological: Positive for dizziness and headaches. Negative for seizures, syncope, speech difficulty, weakness, light-headedness and numbness.    Social History   Tobacco Use  . Smoking status: Never Smoker  . Smokeless tobacco: Never Used  Substance Use Topics  . Alcohol use: Yes    Alcohol/week: 2.4 oz    Types: 4 Cans of beer per week   Objective:   BP 112/68 (BP Location: Right Arm, Patient Position: Sitting, Cuff Size: Large)   Pulse 76   Temp 99 F (37.2 C) (Oral)   Resp 16   Wt 201 lb (91.2 kg)   BMI 29.68 kg/m  Vitals:   03/02/18 1103  BP: 112/68  Pulse: 76  Resp: 16  Temp: 99 F (37.2 C)  TempSrc: Oral  Weight: 201 lb (91.2 kg)     Physical Exam  Constitutional: He is oriented to person, place, and time. He appears well-developed and well-nourished.  Cardiovascular: Normal rate and regular rhythm.  No murmur heard. Pulmonary/Chest: Effort normal and breath sounds normal.  Neurological: He is  alert and oriented to person, place, and time.  Skin: Skin is warm and dry.  Psychiatric: He has a normal mood and affect. His behavior is normal.        Assessment & Plan:     1. Chest pain, unspecified type  EKG normal today without ST elevations. Chest pain moderately since Monday, unlikely cardiac based on description. More likely MSK due to heavy weight lifting.   - EKG 12-Lead  2. Dizziness  Likely orthostatic due to cumulative effects of Cialis and flomax. Recommend stopping cialis and continuing only with Flomax.  Return if symptoms worsen or fail to  improve.  The entirety of the information documented in the History of Present Illness, Review of Systems and Physical Exam were personally obtained by me. Portions of this information were initially documented by Ashley Royalty, CMA and reviewed by me for thoroughness and accuracy.          Trinna Post, PA-C  [facility]

## 2018-03-02 NOTE — Patient Instructions (Signed)
Hypotension As your heart beats, it forces blood through your body. This force is called blood pressure. If you have hypotension, you have low blood pressure. When your blood pressure is too low, you may not get enough blood to your brain. You may feel weak, feel light-headed, have a fast heartbeat, or even pass out (faint). Follow these instructions at home: Eating and drinking  Drink enough fluids to keep your pee (urine) clear or pale yellow.  Eat a healthy diet, and follow instructions from your doctor about eating or drinking restrictions. A healthy diet includes: ? Fresh fruits and vegetables. ? Whole grains. ? Low-fat (lean) meats. ? Low-fat dairy products.  Eat extra salt only as told. Do not add extra salt to your diet unless your doctor tells you to.  Eat small meals often.  Avoid standing up quickly after you eat. Medicines  Take over-the-counter and prescription medicines only as told by your doctor. ? Follow instructions from your doctor about changing how much you take (the dosage) of your medicines, if this applies. ? Do not stop or change your medicine on your own. General instructions  Wear compression stockings as told by your doctor.  Get up slowly from lying down or sitting.  Avoid hot showers and a lot of heat as told by your doctor.  Return to your normal activities as told by your doctor. Ask what activities are safe for you.  Do not use any products that contain nicotine or tobacco, such as cigarettes and e-cigarettes. If you need help quitting, ask your doctor.  Keep all follow-up visits as told by your doctor. This is important. Contact a doctor if:  You throw up (vomit).  You have watery poop (diarrhea).  You have a fever for more than 2-3 days.  You feel more thirsty than normal.  You feel weak and tired. Get help right away if:  You have chest pain.  You have a fast or irregular heartbeat.  You lose feeling (get numbness) in any part  of your body.  You cannot move your arms or your legs.  You have trouble talking.  You get sweaty or feel light-headed.  You faint.  You have trouble breathing.  You have trouble staying awake.  You feel confused. This information is not intended to replace advice given to you by your health care provider. Make sure you discuss any questions you have with your health care provider. Document Released: 12/14/2009 Document Revised: 06/07/2016 Document Reviewed: 06/07/2016 Elsevier Interactive Patient Education  2017 [employer]   

## 2018-03-12 DIAGNOSIS — G4733 Obstructive sleep apnea (adult) (pediatric): Secondary | ICD-10-CM | POA: Diagnosis not present

## 2018-05-25 ENCOUNTER — Ambulatory Visit: Payer: BLUE CROSS/BLUE SHIELD | Admitting: Physician Assistant

## 2018-05-25 VITALS — BP 116/76 | HR 88 | Temp 98.5°F | Resp 16 | Wt 201.0 lb

## 2018-05-25 DIAGNOSIS — Z13220 Encounter for screening for lipoid disorders: Secondary | ICD-10-CM

## 2018-05-25 DIAGNOSIS — N400 Enlarged prostate without lower urinary tract symptoms: Secondary | ICD-10-CM | POA: Diagnosis not present

## 2018-05-25 DIAGNOSIS — R42 Dizziness and giddiness: Secondary | ICD-10-CM

## 2018-05-25 MED ORDER — TAMSULOSIN HCL 0.4 MG PO CAPS: 0.4000 mg | ORAL_CAPSULE | Freq: Every day | ORAL | 0 refills | Status: DC

## 2018-05-25 NOTE — Patient Instructions (Signed)
Orthostatic Hypotension Orthostatic hypotension is a sudden drop in blood pressure that happens when you quickly change positions, such as when you get up from a seated or lying position. Blood pressure is a measurement of how strongly, or weakly, your blood is pressing against the walls of your arteries. Arteries are blood vessels that carry blood from your heart throughout your body. When blood pressure is too low, you may not get enough blood to your brain or to the rest of your organs. This can cause weakness, light-headedness, rapid heartbeat, and fainting. This can last for just a few seconds or for up to a few minutes. Orthostatic hypotension is usually not a serious problem. However, if it happens frequently or gets worse, it may be a sign of something more serious. What are the causes? This condition may be caused by:  Sudden changes in posture, such as standing up quickly after you have been sitting or lying down.  Blood loss.  Loss of body fluids (dehydration).  Heart problems.  Hormone (endocrine) problems.  Pregnancy.  Severe infection.  Lack of certain nutrients.  Severe allergic reactions (anaphylaxis).  Certain medicines, such as blood pressure medicine or medicines that make the body lose excess fluids (diuretics). Sometimes, this condition can be caused by not taking medicine as directed, such as taking too much of a certain medicine.  What increases the risk? Certain factors can make you more likely to develop orthostatic hypotension, including:  Age. Risk increases as you get older.  Conditions that affect the heart or the central nervous system.  Taking certain medicines, such as blood pressure medicine or diuretics.  Being pregnant.  What are the signs or symptoms? Symptoms of this condition may include:  Weakness.  Light-headedness.  Dizziness.  Blurred vision.  Fatigue.  Rapid heartbeat.  Fainting, in severe cases.  How is this  diagnosed? This condition is diagnosed based on:  Your medical history.  Your symptoms.  Your blood pressure measurement. Your health care provider will check your blood pressure when you are: ? Lying down. ? Sitting. ? Standing.  A blood pressure reading is recorded as two numbers, such as "120 over 80" (or 120/80). The first ("top") number is called the systolic pressure. It is a measure of the pressure in your arteries as your heart beats. The second ("bottom") number is called the diastolic pressure. It is a measure of the pressure in your arteries when your heart relaxes between beats. Blood pressure is measured in a unit called mm Hg. Healthy blood pressure for adults is 120/80. If your blood pressure is below 90/60, you may be diagnosed with hypotension. Other information or tests that may be used to diagnose orthostatic hypotension include:  Your other vital signs, such as your heart rate and temperature.  Blood tests.  Tilt table test. For this test, you will be safely secured to a table that moves you from a lying position to an upright position. Your heart rhythm and blood pressure will be monitored during the test.  How is this treated? Treatment for this condition may include:  Changing your diet. This may involve eating more salt (sodium) or drinking more water.  Taking medicines to raise your blood pressure.  Changing the dosage of certain medicines you are taking that might be lowering your blood pressure.  Wearing compression stockings. These stockings help to prevent blood clots and reduce swelling in your legs.  In some cases, you may need to go to the hospital for:    Fluid replacement. This means you will receive fluids through an IV tube.  Blood replacement. This means you will receive donated blood through an IV tube (transfusion).  Treating an infection or heart problems, if this applies.  Monitoring. You may need to be monitored while medicines that you  are taking wear off.  Follow these instructions at home: Eating and drinking   Drink enough fluid to keep your urine clear or pale yellow.  Eat a healthy diet and follow instructions from your health care provider about eating or drinking restrictions. A healthy diet includes: ? Fresh fruits and vegetables. ? Whole grains. ? Lean meats. ? Low-fat dairy products.  Eat extra salt only as directed. Do not add extra salt to your diet unless your health care provider told you to do that.  Eat frequent, small meals.  Avoid standing up suddenly after eating. Medicines  Take over-the-counter and prescription medicines only as told by your health care provider. ? Follow instructions from your health care provider about changing the dosage of your current medicines, if this applies. ? Do not stop or adjust any of your medicines on your own. General instructions  Wear compression stockings as told by your health care provider.  Get up slowly from lying down or sitting positions. This gives your blood pressure a chance to adjust.  Avoid hot showers and excessive heat as directed by your health care provider.  Return to your normal activities as told by your health care provider. Ask your health care provider what activities are safe for you.  Do not use any products that contain nicotine or tobacco, such as cigarettes and e-cigarettes. If you need help quitting, ask your health care provider.  Keep all follow-up visits as told by your health care provider. This is important. Contact a health care provider if:  You vomit.  You have diarrhea.  You have a fever for more than 2-3 days.  You feel more thirsty than usual.  You feel weak and tired. Get help right away if:  You have chest pain.  You have a fast or irregular heartbeat.  You develop numbness in any part of your body.  You cannot move your arms or your legs.  You have trouble speaking.  You become sweaty or feel  lightheaded.  You faint.  You feel short of breath.  You have trouble staying awake.  You feel confused. This information is not intended to replace advice given to you by your health care provider. Make sure you discuss any questions you have with your health care provider. Document Released: 09/09/2002 Document Revised: 06/07/2016 Document Reviewed: 03/11/2016 Elsevier Interactive Patient Education  2018 Elsevier Inc.  

## 2018-05-25 NOTE — Progress Notes (Signed)
Patient: Travis Mahoney Male    DOB: Apr 13, 1966   52 y.o.   MRN: [id number] Visit Date: 05/25/2018  Today's Provider: Trinna Post, PA-C   Chief Complaint  Patient presents with  . Dizziness   Subjective:    HPI   Patient here today C/O recurrent dizziness worsening in the last week. He was seen for dizziness in May 2019. At that point he was on both Cialis and Flomax. He was instructed to stop his Cialis and he did so. He reports this brought about improvement for a time, however dizziness did come down. Patient reports dizziness is worse in the morning and when he is laying down. Patient reports some headaches and dry mouth, denies nausea or vomiting. Patient reports he feels better after eating. He does drink some beers at night, two cups of coffee in the morning.  Patient concerned that CPAP is causing dizziness and dryness in mouth. Patient reports he stopped using CPAP and feels better.    Allergies  Allergen Reactions  . Penicillins Itching and Swelling  . Shellfish Allergy      Current Outpatient Medications:  .  tamsulosin (FLOMAX) 0.4 MG CAPS capsule, Take 1 capsule (0.4 mg total) by mouth daily after supper., Disp: 90 capsule, Rfl: 0  Review of Systems  Constitutional: Negative.   Respiratory: Negative.   Cardiovascular: Negative.   Neurological: Positive for dizziness and headaches.    Social History   Tobacco Use  . Smoking status: Never Smoker  . Smokeless tobacco: Never Used  Substance Use Topics  . Alcohol use: Yes    Alcohol/week: 4.0 standard drinks    Types: 4 Cans of beer per week   Objective:   BP 116/76 (BP Location: Left Arm, Patient Position: Sitting, Cuff Size: Large)   Pulse 88   Temp 98.5 F (36.9 C) (Oral)   Resp 16   Wt 201 lb (91.2 kg)   SpO2 99%   BMI 29.68 kg/m  Vitals:   05/25/18 1011  BP: 116/76  Pulse: 88  Resp: 16  Temp: 98.5 F (36.9 C)  TempSrc: Oral  SpO2: 99%  Weight: 201 lb (91.2 kg)     Physical  Exam  Constitutional: He is oriented to person, place, and time. He appears well-developed and well-nourished.  Cardiovascular: Normal rate and regular rhythm.  Pulmonary/Chest: Effort normal and breath sounds normal.  Neurological: He is alert and oriented to person, place, and time.  Skin: Skin is warm and dry.  Psychiatric: He has a normal mood and affect. His behavior is normal.        Assessment & Plan:     1. Dizziness  Vital signs are not orthostatic today. However, his BP is low normal and in combination with Flomax I believe this is causing his dizziness. We can try writing for brand name as he reports he did not feel these symptoms. He can also do trial off Flomax to see if this improves symptoms. He may have to weight benefit of flomax vs. Intolerance of symptoms. Labs as below.  - TSH - CBC With Differential - Comprehensive Metabolic Panel (CMET)  2. Benign prostatic hyperplasia, unspecified whether lower urinary tract symptoms present  - tamsulosin (FLOMAX) 0.4 MG CAPS capsule; Take 1 capsule (0.4 mg total) by mouth daily after supper.  Dispense: 90 capsule; Refill: 0  3. Lipid screening  - Lipid Profile  Return if symptoms worsen or fail to improve.  The entirety of  the information documented in the History of Present Illness, Review of Systems and Physical Exam were personally obtained by me. Portions of this information were initially documented by Lynford Humphrey, CMA and reviewed by me for thoroughness and accuracy.        Trinna Post, PA-C  [facility]

## 2018-05-26 LAB — CBC WITH DIFFERENTIAL
Basophils Absolute: 0 10*3/uL (ref 0.0–0.2)
Basos: 1 %
EOS (ABSOLUTE): 0.1 10*3/uL (ref 0.0–0.4)
Eos: 2 %
Hematocrit: 46.2 % (ref 37.5–51.0)
Hemoglobin: 16 g/dL (ref 13.0–17.7)
Immature Grans (Abs): 0 10*3/uL (ref 0.0–0.1)
Immature Granulocytes: 0 %
Lymphocytes Absolute: 1.6 10*3/uL (ref 0.7–3.1)
Lymphs: 29 %
MCH: 31.6 pg (ref 26.6–33.0)
MCHC: 34.6 g/dL (ref 31.5–35.7)
MCV: 91 fL (ref 79–97)
Monocytes Absolute: 0.5 10*3/uL (ref 0.1–0.9)
Monocytes: 9 %
Neutrophils Absolute: 3.1 10*3/uL (ref 1.4–7.0)
Neutrophils: 59 %
RBC: 5.07 x10E6/uL (ref 4.14–5.80)
RDW: 12.7 % (ref 12.3–15.4)
WBC: 5.3 10*3/uL (ref 3.4–10.8)

## 2018-05-26 LAB — COMPREHENSIVE METABOLIC PANEL
ALT: 38 IU/L (ref 0–44)
AST: 36 IU/L (ref 0–40)
Albumin/Globulin Ratio: 1.7 (ref 1.2–2.2)
Albumin: 4.6 g/dL (ref 3.5–5.5)
Alkaline Phosphatase: 62 IU/L (ref 39–117)
BUN/Creatinine Ratio: 13 (ref 9–20)
BUN: 15 mg/dL (ref 6–24)
Bilirubin Total: 0.4 mg/dL (ref 0.0–1.2)
CO2: 23 mmol/L (ref 20–29)
Calcium: 9.5 mg/dL (ref 8.7–10.2)
Chloride: 101 mmol/L (ref 96–106)
Creatinine, Ser: 1.2 mg/dL (ref 0.76–1.27)
GFR calc Af Amer: 80 mL/min/{1.73_m2} (ref >59)
GFR calc non Af Amer: 69 mL/min/{1.73_m2} (ref >59)
Globulin, Total: 2.7 g/dL (ref 1.5–4.5)
Glucose: 95 mg/dL (ref 65–99)
Potassium: 3.9 mmol/L (ref 3.5–5.2)
Sodium: 138 mmol/L (ref 134–144)
Total Protein: 7.3 g/dL (ref 6.0–8.5)

## 2018-05-26 LAB — LIPID PANEL
Chol/HDL Ratio: 4.4 ratio (ref 0.0–5.0)
Cholesterol, Total: 218 mg/dL — ABNORMAL HIGH (ref 100–199)
HDL: 50 mg/dL (ref >39)
LDL Calculated: 154 mg/dL — ABNORMAL HIGH (ref 0–99)
Triglycerides: 69 mg/dL (ref 0–149)
VLDL Cholesterol Cal: 14 mg/dL (ref 5–40)

## 2018-05-26 LAB — TSH: TSH: 2.16 u[IU]/mL (ref 0.450–4.500)

## 2018-05-29 ENCOUNTER — Telehealth: Payer: Self-pay

## 2018-05-29 NOTE — Telephone Encounter (Signed)
Patient advised as below.  

## 2018-05-29 NOTE — Telephone Encounter (Signed)
-----   Message from Trinna Post, [place] sent at 05/28/2018  4:54 PM EDT ----- Worthy Keeler normal except for cholesterol which is slightly high. Does not require treatment yet. Would recommend reducing saturated fat and sugar intake. He already exercises frequently.

## 2018-05-29 NOTE — Telephone Encounter (Signed)
Pt return call. Thanks TNP

## 2018-05-29 NOTE — Telephone Encounter (Signed)
Na

## 2018-06-14 DIAGNOSIS — M461 Sacroiliitis, not elsewhere classified: Secondary | ICD-10-CM | POA: Diagnosis not present

## 2018-06-14 DIAGNOSIS — M9903 Segmental and somatic dysfunction of lumbar region: Secondary | ICD-10-CM | POA: Diagnosis not present

## 2018-06-14 DIAGNOSIS — M9904 Segmental and somatic dysfunction of sacral region: Secondary | ICD-10-CM | POA: Diagnosis not present

## 2018-06-14 DIAGNOSIS — M545 Low back pain: Secondary | ICD-10-CM | POA: Diagnosis not present

## 2018-06-19 DIAGNOSIS — M545 Low back pain: Secondary | ICD-10-CM | POA: Diagnosis not present

## 2018-06-19 DIAGNOSIS — M9904 Segmental and somatic dysfunction of sacral region: Secondary | ICD-10-CM | POA: Diagnosis not present

## 2018-06-19 DIAGNOSIS — M461 Sacroiliitis, not elsewhere classified: Secondary | ICD-10-CM | POA: Diagnosis not present

## 2018-06-19 DIAGNOSIS — M9903 Segmental and somatic dysfunction of lumbar region: Secondary | ICD-10-CM | POA: Diagnosis not present

## 2018-07-27 ENCOUNTER — Other Ambulatory Visit: Payer: Self-pay | Admitting: Physician Assistant

## 2018-07-27 DIAGNOSIS — N400 Enlarged prostate without lower urinary tract symptoms: Secondary | ICD-10-CM

## 2018-07-30 NOTE — Telephone Encounter (Signed)
[facility] faxed refill request for tamsulosin (FLOMAX) 0.4 MG CAPS capsule. Please advise. Thanks TNP

## 2018-10-11 ENCOUNTER — Ambulatory Visit: Payer: BLUE CROSS/BLUE SHIELD | Admitting: Physician Assistant

## 2018-10-11 ENCOUNTER — Encounter: Payer: Self-pay | Admitting: Physician Assistant

## 2018-10-11 VITALS — BP 122/83 | HR 85 | Temp 97.9°F | Resp 16 | Wt 212.0 lb

## 2018-10-11 DIAGNOSIS — G4730 Sleep apnea, unspecified: Secondary | ICD-10-CM

## 2018-10-11 DIAGNOSIS — N400 Enlarged prostate without lower urinary tract symptoms: Secondary | ICD-10-CM | POA: Diagnosis not present

## 2018-10-11 DIAGNOSIS — M7541 Impingement syndrome of right shoulder: Secondary | ICD-10-CM | POA: Diagnosis not present

## 2018-10-11 MED ORDER — TAMSULOSIN HCL 0.4 MG PO CAPS: ORAL_CAPSULE | ORAL | 0 refills | Status: DC

## 2018-10-11 NOTE — Patient Instructions (Signed)

## 2018-10-11 NOTE — Progress Notes (Signed)
Patient: Travis Mahoney Male    DOB: May 09, 1966   53 y.o.   MRN: [id number] Visit Date: 10/11/2018  Today's Provider: Trinna Post, PA-C   Chief Complaint  Patient presents with  . Shoulder Pain  . Sleep Apnea   Subjective:     HPI Patient here today c/o bilateral shoulder pain. Patient reports pain in both shoulders, right worse than left. He is a frequent weight lifter and reports pain with overhead motions and exercises that require him to lift his hands above his head. He denies any specific injuries. He denies ever having surgeries on this area  Sleep Apnea: He reports having difficulty using his CPAP machine. He was diagnosed with sleep apnea roughly 1 year ago and has been using CPAP since then. He finds it difficult to breathe out against the machine to the extent that he is not able to use the mask.   Allergies  Allergen Reactions  . Penicillins Itching and Swelling  . Shellfish Allergy      Current Outpatient Medications:  .  tamsulosin (FLOMAX) 0.4 MG CAPS capsule, TAKE ONE CAPSULE BY MOUTH DAILY AFTER SUPPER, Disp: 90 capsule, Rfl: 0  Review of Systems  Constitutional: Negative.   Respiratory: Negative.   Cardiovascular: Negative.     Social History   Tobacco Use  . Smoking status: Never Smoker  . Smokeless tobacco: Never Used  Substance Use Topics  . Alcohol use: Yes    Alcohol/week: 4.0 standard drinks    Types: 4 Cans of beer per week      Objective:   BP 122/83 (BP Location: Left Arm, Patient Position: Sitting, Cuff Size: Large)   Pulse 85   Temp 97.9 F (36.6 C) (Oral)   Resp 16   Wt 212 lb (96.2 kg)   SpO2 96%   BMI 31.31 kg/m  Vitals:   10/11/18 0819  BP: 122/83  Pulse: 85  Resp: 16  Temp: 97.9 F (36.6 C)  TempSrc: Oral  SpO2: 96%  Weight: 212 lb (96.2 kg)     Physical Exam Constitutional:      Appearance: Normal appearance.  Musculoskeletal:     Comments: Some minimal pain with shoulder abduction bilaterally  past 90 degrees. Also has some minimal pain bilaterally with Hawkins and Neers. Strength 5/5 bilateral upper extremities.   Skin:    General: Skin is warm and dry.  Neurological:     General: No focal deficit present.     Mental Status: He is alert and oriented to person, place, and time. Mental status is at baseline.  Psychiatric:        Mood and Affect: Mood normal.        Behavior: Behavior normal.         Assessment & Plan    1. Impingement syndrome of right shoulder  Bilateral impingement syndrome, likely related to frequent weight lifting. Have advised him to reduce overhead motion exercises for the next few weeks to observe for improvement. Can use Prn anti-inflammatories and ice.   2. Sleep apnea, unspecified type  He is not tolerant of CPAP machine. Have contacted [facility] who supplied his CPAP machine to see if he can get BIPAP machine. They report they will contact his insurance to work on this.   3. Benign prostatic hyperplasia, unspecified whether lower urinary tract symptoms present  - tamsulosin (FLOMAX) 0.4 MG CAPS capsule; TAKE ONE CAPSULE BY MOUTH DAILY AFTER SUPPER  Dispense: 90 capsule; Refill:  0  Return if symptoms worsen or fail to improve.  The entirety of the information documented in the History of Present Illness, Review of Systems and Physical Exam were personally obtained by me. Portions of this information were initially documented by Lynford Humphrey, CMA and reviewed by me for thoroughness and accuracy.       Trinna Post, PA-C  [facility]

## 2018-11-26 ENCOUNTER — Other Ambulatory Visit: Payer: Self-pay | Admitting: Physician Assistant

## 2018-11-26 DIAGNOSIS — N400 Enlarged prostate without lower urinary tract symptoms: Secondary | ICD-10-CM

## 2018-11-27 ENCOUNTER — Other Ambulatory Visit: Payer: Self-pay | Admitting: Physician Assistant

## 2018-11-27 DIAGNOSIS — N529 Male erectile dysfunction, unspecified: Secondary | ICD-10-CM

## 2018-11-27 NOTE — Telephone Encounter (Signed)
We had stopped this 05/2018 due to the fact that I suspected it was causing his dizzy spells in combination with the flomax. He stopped this medication and kept taking flomax and his dizziness improved. Taking these two medications together is likely to cause dizziness and low blood pressure.

## 2018-11-27 NOTE — Telephone Encounter (Signed)
Pt would like a refill on the Cialis 5 mg  [facility]    CB#  [phone]  Thanks Con Memos

## 2018-11-27 NOTE — Telephone Encounter (Signed)
Pt checking back on the refill of the tadalafil (CIALIS) 5 MG tablet. He said he was in recently and was seen by [place].    Pt asking if this could be refilled and if he has to come in to let him know. Please call him at [phone].  Thanks, [employer]

## 2018-11-30 MED ORDER — TADALAFIL 5 MG PO TABS: ORAL_TABLET | ORAL | 0 refills | Status: DC

## 2018-12-07 ENCOUNTER — Other Ambulatory Visit: Payer: Self-pay | Admitting: Physician Assistant

## 2018-12-07 DIAGNOSIS — N529 Male erectile dysfunction, unspecified: Secondary | ICD-10-CM

## 2018-12-18 ENCOUNTER — Other Ambulatory Visit: Payer: Self-pay | Admitting: Physician Assistant

## 2018-12-18 DIAGNOSIS — N529 Male erectile dysfunction, unspecified: Secondary | ICD-10-CM

## 2019-02-22 ENCOUNTER — Other Ambulatory Visit: Payer: Self-pay | Admitting: Physician Assistant

## 2019-02-22 DIAGNOSIS — N400 Enlarged prostate without lower urinary tract symptoms: Secondary | ICD-10-CM

## 2019-05-20 ENCOUNTER — Other Ambulatory Visit: Payer: Self-pay | Admitting: Physician Assistant

## 2019-05-20 DIAGNOSIS — N400 Enlarged prostate without lower urinary tract symptoms: Secondary | ICD-10-CM

## 2019-08-16 ENCOUNTER — Other Ambulatory Visit: Payer: Self-pay | Admitting: Physician Assistant

## 2019-08-16 DIAGNOSIS — N400 Enlarged prostate without lower urinary tract symptoms: Secondary | ICD-10-CM

## 2019-08-16 NOTE — Telephone Encounter (Signed)
L.O.V. was on 10/11/2018 and no upcoming appointment, please advise.

## 2019-09-20 ENCOUNTER — Telehealth: Payer: Self-pay | Admitting: Physician Assistant

## 2019-09-20 NOTE — Telephone Encounter (Signed)
Copied from [place] [id number]. Topic: General - Inquiry >> Sep 20, 2019  9:33 AM Scherrie Gerlach wrote: Reason for CRM: pt states he has had PNA 2 X in 5 yrs and requesting to get the PNA vaccine.  Please advise if ok, since he is not 34yrs. Pt states he got the flu shot at work.

## 2019-09-23 NOTE — Telephone Encounter (Signed)
Called number in patient's chart and the person who answered the phone states that the patient is his brother and he does not know the patient number for me to reach out to patient. He state he will have patient to reach out to the office. FYI

## 2019-09-23 NOTE — Telephone Encounter (Signed)
Pneumonia vaccine wouldn't be indicated again until 76 or for select other health issues which he does not have at this point.

## 2019-09-24 NOTE — Telephone Encounter (Signed)
Please advise 

## 2019-09-24 NOTE — Telephone Encounter (Signed)
Pt called back and is requesting to have a vaccine because he says that he has pneumonia twice and does not want to have it again. (Advised of PCP's message)

## 2019-09-25 ENCOUNTER — Encounter: Payer: Self-pay | Admitting: Physician Assistant

## 2019-09-25 ENCOUNTER — Other Ambulatory Visit: Payer: Self-pay

## 2019-09-25 ENCOUNTER — Ambulatory Visit (INDEPENDENT_AMBULATORY_CARE_PROVIDER_SITE_OTHER): Payer: BC Managed Care – PPO | Admitting: Physician Assistant

## 2019-09-25 VITALS — BP 129/85 | HR 88 | Temp 96.8°F | Resp 16 | Ht 69.0 in | Wt 204.0 lb

## 2019-09-25 DIAGNOSIS — M7541 Impingement syndrome of right shoulder: Secondary | ICD-10-CM | POA: Diagnosis not present

## 2019-09-25 DIAGNOSIS — M7542 Impingement syndrome of left shoulder: Secondary | ICD-10-CM | POA: Diagnosis not present

## 2019-09-25 MED ORDER — PREDNISONE 10 MG PO TABS: ORAL_TABLET | ORAL | 0 refills | Status: DC

## 2019-09-25 NOTE — Patient Instructions (Signed)
Secondary Shoulder Impingement Syndrome  Shoulder impingement syndrome is a condition that causes pain when connective tissues (tendons) surrounding the shoulder joint become pinched. These tendons are part of the group of muscles and tissues that help to stabilize the shoulder (rotator cuff). Secondary impingement syndrome occurs when movement of the shoulder joint is abnormal. This can happen if there is too much movement, too little movement (stiffness), or abnormal movement. What are the causes? This condition may be caused by:  Shoulder blade muscles that are weak or uncoordinated (scapular dyskinesis).  Glenohumeral instability. This happens when there is too much movement of the upper arm bone and can result from: ? Having loose joints. ? An injury that happened during repeated overhead arm movements, such as throwing.  A hard, direct hit to the shoulder. This is rare. What increases the risk? You may be more likely to develop this condition if you have injured your shoulder in the past or if you are an athlete who participates in:  Sports that involve throwing, such as baseball.  Tennis.  Swimming.  Volleyball. What are the signs or symptoms? The main symptom of this condition is pain on the front or side of the shoulder. Pain may:  Get worse when lifting or raising your arm.  Get worse at night.  Wake you up from sleeping.  Feel sharp when your shoulder is moved, and then fade to an ache. Other symptoms include:  Tenderness.  Stiffness.  Inability to raise the arm above shoulder level or behind the body.  Weakness. How is this diagnosed? This condition is diagnosed based on your symptoms, your medical history, and a physical exam. You may also have imaging studies such as:  X-rays.  MRI.  Ultrasound. How is this treated? Treatment for this condition may include:  Resting your shoulder and avoiding all activities that cause pain or put stress on the  shoulder.  Putting ice on your shoulder.  Taking NSAIDs, such as ibuprofen, to help reduce pain and swelling.  Having injections of medicines to numb the area and reduce inflammation.  Doing exercises to help you regain movement (physical therapy).  Having surgery. This may be needed if nonsurgical treatments do not help. Surgery may involve stabilizing your shoulder and repairing your rotator cuff, if needed. Follow these instructions at home: Managing pain, stiffness, and swelling      If directed, put ice on the injured area. ? Put ice in a plastic bag. ? Place a towel between your skin and the bag. ? Leave the ice on for 20 minutes, 2-3 times a day.  If directed, apply heat to the affected area as often as told by your health care provider. Use the heat source that your health care provider recommends, such as a moist heat pack or a heating pad. ? Place a towel between your skin and the heat source. ? Leave the heat on for 20-30 minutes. ? Remove the heat if your skin turns bright red. This is especially important if you are unable to feel pain, heat, or cold. You may have a greater risk of getting burned. Activity  Rest and return to your normal activities as told by your health care provider. Ask your health care provider what activities are safe for you.  Do exercises as told by your health care provider.  Ask your health care provider when it is safe for you to drive. General instructions  Take over-the-counter and prescription medicines only as told by your health care  provider.  Do not use any products that contain nicotine or tobacco, such as cigarettes, e-cigarettes, and chewing tobacco. If you need help quitting, ask your health care provider.  Keep all follow-up visits as told by your health care provider. This is important. How is this prevented?  Give your body time to rest between periods of activity.  Maintain physical fitness, including strength in your  shoulder muscles and back muscles. Contact a health care provider if:  Your symptoms have not improved after 2-3 months of treatment.  Your symptoms are getting worse. Summary  Shoulder impingement syndrome is a condition that causes pain when connective tissues (tendons) surrounding the shoulder joint become pinched.  Secondary impingement syndrome occurs when movement of the shoulder joint is abnormal.  The main symptom of this condition is pain on the front or side of the shoulder.  It is treated with rest, ice, medicines, physical therapy, and surgery as needed. This information is not intended to replace advice given to you by your health care provider. Make sure you discuss any questions you have with your health care provider. Document Released: 09/19/2005 Document Revised: 01/11/2019 Document Reviewed: 11/21/2018 Elsevier Patient Education  2020 [employer].

## 2019-09-25 NOTE — Progress Notes (Signed)
Patient: Travis Mahoney Male    DOB: 03-26-1966   53 y.o.   MRN: [id number] Visit Date: 09/25/2019  Today's Provider: Trinna Post, PA-C   Chief Complaint  Patient presents with  . Shoulder Pain   Subjective:     Shoulder Pain  The pain is present in the left shoulder, right shoulder and left elbow. The current episode started more than 1 year ago. There has been no history of extremity trauma. The problem occurs constantly. The quality of the pain is described as aching and sharp. The pain is at a severity of 8/10. The pain is severe. Associated symptoms include an inability to bear weight, joint swelling, a limited range of motion, numbness and stiffness. Pertinent negatives include no fever, itching, joint locking or tingling. The symptoms are aggravated by activity and lying down. He has tried heat and acetaminophen for the symptoms. The treatment provided no relief.    Patient has had bilateral shoulder pain for over a year. Patient states pain has worsen recently. Patient was seen for left shoulder impingement 10/2018. He did stop weight lifting for a period of time which helped and when he resumed he felt the pain came right back.  Patient states left shoulder is worse. Left shoulder pain does radiate down into his bicep. Patient has been treating pain with tylenol and heat with no relief.  Allergies  Allergen Reactions  . Penicillins Itching and Swelling  . Shellfish Allergy      Current Outpatient Medications:  .  tamsulosin (FLOMAX) 0.4 MG CAPS capsule, TAKE 1 CAPSULE BY MOUTH DAILY AFTER SUPPER, Disp: 90 capsule, Rfl: 0 .  tadalafil (CIALIS) 5 MG tablet, TAKE 1 TABLET BY MOUTH PRTOR TO SEXUAL ACTIVITY AS NEEDED (Patient not taking: Reported on 09/25/2019), Disp: 10 tablet, Rfl: 0  Review of Systems  Constitutional: Negative for appetite change, chills and fever.  Respiratory: Negative for chest tightness, shortness of breath and wheezing.   Cardiovascular:  Negative for chest pain and palpitations.  Gastrointestinal: Negative for abdominal pain, nausea and vomiting.  Musculoskeletal: Positive for stiffness.  Skin: Negative for itching.  Neurological: Positive for numbness. Negative for tingling.    Social History   Tobacco Use  . Smoking status: Never Smoker  . Smokeless tobacco: Never Used  Substance Use Topics  . Alcohol use: Yes    Alcohol/week: 4.0 standard drinks    Types: 4 Cans of beer per week      Objective:   BP 129/85 (BP Location: Right Arm, Patient Position: Sitting, Cuff Size: Large)   Pulse 88   Temp (!) 96.8 F (36 C) (Other (Comment))   Resp 16   Ht 5\' 9"  (1.753 m)   Wt 204 lb (92.5 kg)   SpO2 96%   BMI 30.13 kg/m  Vitals:   09/25/19 1615  BP: 129/85  Pulse: 88  Resp: 16  Temp: (!) 96.8 F (36 C)  TempSrc: Other (Comment)  SpO2: 96%  Weight: 204 lb (92.5 kg)  Height: 5\' 9"  (1.753 m)  Body mass index is 30.13 kg/m.   Physical Exam Constitutional:      Appearance: Normal appearance.  Musculoskeletal:     Right shoulder: Decreased range of motion.     Left shoulder: Decreased range of motion.     Comments: Pain with bilateral shoulder abduction past 90 degrees. Positive belly press and lift off test.   Skin:    General: Skin is warm and dry.  Neurological:     Mental Status: He is alert and oriented to person, place, and time. Mental status is at baseline.  Psychiatric:        Mood and Affect: Mood normal.        Behavior: Behavior normal.      No results found for any visits on 09/25/19.     Assessment & Plan    1. Impingement syndrome of both shoulders  Discussed stepwise approach to shoulder impingement syndrome. Will do steroids as below.   - predniSONE (DELTASONE) 10 MG tablet; Take 6 pills on day 1, take 5 pills on day 2, and so on until complete.  Dispense: 21 tablet; Refill: 0  The entirety of the information documented in the History of Present Illness, Review of Systems and  Physical Exam were personally obtained by me. Portions of this information were initially documented by April M. Sabra Heck, CMA and reviewed by me for thoroughness and accuracy.      Trinna Post, PA-C  [facility]

## 2019-11-11 ENCOUNTER — Other Ambulatory Visit: Payer: Self-pay | Admitting: Physician Assistant

## 2019-11-11 DIAGNOSIS — N400 Enlarged prostate without lower urinary tract symptoms: Secondary | ICD-10-CM

## 2019-12-17 ENCOUNTER — Ambulatory Visit: Payer: Self-pay | Attending: Internal Medicine

## 2019-12-17 DIAGNOSIS — Z23 Encounter for immunization: Secondary | ICD-10-CM

## 2019-12-17 NOTE — Progress Notes (Signed)
   Covid-19 Vaccination Clinic  Name:  Travis Mahoney    MRN: [id number] DOB: Jun 08, 1966  12/17/2019  [name] was observed post Covid-19 immunization for 15 minutes without incident. He was provided with Vaccine Information Sheet and instruction to access the V-Safe system.   [name] was instructed to call 911 with any severe reactions post vaccine: Marland Kitchen Difficulty breathing  . Swelling of face and throat  . A fast heartbeat  . A bad rash all over body  . Dizziness and weakness   Immunizations Administered    Name Date Dose VIS Date Route   Pfizer COVID-19 Vaccine 12/17/2019  2:11 PM 0.3 mL 09/13/2019 Intramuscular   Manufacturer: [place]   Lot: [id number]   [place]: [id number]

## 2020-05-04 ENCOUNTER — Other Ambulatory Visit: Payer: Self-pay | Admitting: Physician Assistant

## 2020-05-04 DIAGNOSIS — N400 Enlarged prostate without lower urinary tract symptoms: Secondary | ICD-10-CM

## 2020-07-30 ENCOUNTER — Other Ambulatory Visit: Payer: Self-pay | Admitting: Physician Assistant

## 2020-07-30 DIAGNOSIS — N400 Enlarged prostate without lower urinary tract symptoms: Secondary | ICD-10-CM

## 2020-07-31 ENCOUNTER — Other Ambulatory Visit: Payer: Self-pay | Admitting: Physician Assistant

## 2020-07-31 DIAGNOSIS — N400 Enlarged prostate without lower urinary tract symptoms: Secondary | ICD-10-CM

## 2020-10-14 ENCOUNTER — Telehealth (INDEPENDENT_AMBULATORY_CARE_PROVIDER_SITE_OTHER): Payer: No Typology Code available for payment source | Admitting: Physician Assistant

## 2020-10-14 ENCOUNTER — Ambulatory Visit: Payer: BC Managed Care – PPO | Admitting: Physician Assistant

## 2020-10-14 DIAGNOSIS — N400 Enlarged prostate without lower urinary tract symptoms: Secondary | ICD-10-CM | POA: Diagnosis not present

## 2020-10-14 DIAGNOSIS — U071 COVID-19: Secondary | ICD-10-CM

## 2020-10-14 MED ORDER — TAMSULOSIN HCL 0.4 MG PO CAPS: 0.4000 mg | ORAL_CAPSULE | Freq: Every day | ORAL | 3 refills | Status: DC

## 2020-10-14 NOTE — Progress Notes (Signed)
MyChart Video Visit    Virtual Visit via Video Note   This visit type was conducted due to national recommendations for restrictions regarding the COVID-19 Pandemic (e.g. social distancing) in an effort to limit this patient's exposure and mitigate transmission in our community. This patient is at least at moderate risk for complications without adequate follow up. This format is felt to be most appropriate for this patient at this time. Physical exam was limited by quality of the video and audio technology used for the visit.   Patient location: Home Provider location: Office   I discussed the limitations of evaluation and management by telemedicine and the availability of in person appointments. The patient expressed understanding and agreed to proceed.  Patient: Travis Mahoney   DOB: Jun 26, 1966   55 y.o. Male  MRN: [id number] Visit Date: 10/14/2020  Today's healthcare provider: Trinna Post, PA-C   Chief Complaint  Patient presents with  . Benign Prostatic Hypertrophy    Patient presents for f/u of BPH. Is Taking flomax 0.4 mg daily with good results and reduction in urinary frequency. He is not having any side effects of this medication.    Subjective    HPI   Currently tested positive for COVID.   HPI    Benign Prostatic Hypertrophy    Comments: Patient presents for f/u of BPH. Is Taking flomax 0.4 mg daily with good results and reduction in urinary frequency. He is not having any side effects of this medication.        Last edited by Trinna Post, PA-C on 10/14/2020  8:20 AM. (History)      Follow up for benign prostatic hyperplasia  The patient was last seen for this 2 years ago. Changes made at last visit include none.  He reports good compliance with treatment. He feels that condition is Improved. He is not having side effects.    -----------------------------------------------------------------------------------------          Medications: Outpatient Medications Prior to Visit  Medication Sig  . [DISCONTINUED] tamsulosin (FLOMAX) 0.4 MG CAPS capsule TAKE 1 CAPSULE BY MOUTH DAILY AFTER SUPPER  . [DISCONTINUED] predniSONE (DELTASONE) 10 MG tablet Take 6 pills on day 1, take 5 pills on day 2, and so on until complete. (Patient not taking: Reported on 10/14/2020)  . [DISCONTINUED] tadalafil (CIALIS) 5 MG tablet TAKE 1 TABLET BY MOUTH PRTOR TO SEXUAL ACTIVITY AS NEEDED (Patient not taking: No sig reported)   No facility-administered medications prior to visit.    Review of Systems    Objective    There were no vitals taken for this visit.   Physical Exam Constitutional:      Appearance: Normal appearance.  Pulmonary:     Effort: Pulmonary effort is normal. No respiratory distress.  Neurological:     Mental Status: He is alert.  Psychiatric:        Mood and Affect: Mood normal.        Behavior: Behavior normal.        Assessment & Plan    1. Benign prostatic hyperplasia, unspecified whether lower urinary tract symptoms present  Stable, refilled x 1 year.   - tamsulosin (FLOMAX) 0.4 MG CAPS capsule; Take 1 capsule (0.4 mg total) by mouth daily.  Dispense: 90 capsule; Refill: 3  2. COVID-19  Patient asymptomatic, he is tested routinely due to work and turned up positive.    Return in about 3 months (around 01/12/2021) for CPE.     I  discussed the assessment and treatment plan with the patient. The patient was provided an opportunity to ask questions and all were answered. The patient agreed with the plan and demonstrated an understanding of the instructions.   The patient was advised to call back or seek an in-person evaluation if the symptoms worsen or if the condition fails to improve as anticipated.   ITrinna Post, PA-C, have reviewed all documentation for this visit. The  documentation on 10/14/20 for the exam, diagnosis, procedures, and orders are all accurate and complete.  The entirety of the information documented in the History of Present Illness, Review of Systems and Physical Exam were personally obtained by me. Portions of this information were initially documented by [facility] and reviewed by me for thoroughness and accuracy.    Paulene Floor [facility] [phone] (phone) [phone] (fax)  [place]

## 2020-10-26 ENCOUNTER — Other Ambulatory Visit: Payer: Self-pay | Admitting: Physician Assistant

## 2020-10-26 DIAGNOSIS — N400 Enlarged prostate without lower urinary tract symptoms: Secondary | ICD-10-CM

## 2020-11-11 ENCOUNTER — Other Ambulatory Visit: Payer: Self-pay

## 2020-11-11 ENCOUNTER — Encounter: Payer: Self-pay | Admitting: Physician Assistant

## 2020-11-11 ENCOUNTER — Ambulatory Visit: Payer: No Typology Code available for payment source | Admitting: Physician Assistant

## 2020-11-11 VITALS — BP 132/97 | HR 84 | Temp 98.6°F | Ht 69.0 in | Wt 206.5 lb

## 2020-11-11 DIAGNOSIS — E785 Hyperlipidemia, unspecified: Secondary | ICD-10-CM

## 2020-11-11 DIAGNOSIS — G4730 Sleep apnea, unspecified: Secondary | ICD-10-CM

## 2020-11-11 DIAGNOSIS — R5383 Other fatigue: Secondary | ICD-10-CM

## 2020-11-11 DIAGNOSIS — Z8616 Personal history of COVID-19: Secondary | ICD-10-CM | POA: Diagnosis not present

## 2020-11-11 NOTE — Patient Instructions (Signed)
Call Sleep company    Fatigue If you have fatigue, you feel tired all the time and have a lack of energy or a lack of motivation. Fatigue may make it difficult to start or complete tasks because of exhaustion. In general, occasional or mild fatigue is often a normal response to activity or life. However, long-lasting (chronic) or extreme fatigue may be a symptom of a medical condition. Follow these instructions at home: General instructions  Watch your fatigue for any changes.  Go to bed and get up at the same time every day.  Avoid fatigue by pacing yourself during the day and getting enough sleep at night.  Maintain a healthy weight. Medicines  Take over-the-counter and prescription medicines only as told by your health care provider.  Take a multivitamin, if told by your health care provider.  Do not use herbal or dietary supplements unless they are approved by your health care provider. Activity  Exercise regularly, as told by your health care provider.  Use or practice techniques to help you relax, such as yoga, tai chi, meditation, or massage therapy.   Eating and drinking  Avoid heavy meals in the evening.  Eat a well-balanced diet, which includes lean proteins, whole grains, plenty of fruits and vegetables, and low-fat dairy products.  Avoid consuming too much caffeine.  Avoid the use of alcohol.  Drink enough fluid to keep your urine pale yellow.   Lifestyle  Change situations that cause you stress. Try to keep your work and personal schedule in balance.  Do not use any products that contain nicotine or tobacco, such as cigarettes and e-cigarettes. If you need help quitting, ask your health care provider.  Do not use drugs. Contact a health care provider if:  Your fatigue does not get better.  You have a fever.  You suddenly lose or gain weight.  You have headaches.  You have trouble falling asleep or sleeping through the night.  You feel angry,  guilty, anxious, or sad.  You are unable to have a bowel movement (constipation).  Your skin is dry.  You have swelling in your legs or another part of your body. Get help right away if:  You feel confused.  Your vision is blurry.  You feel faint or you pass out.  You have a severe headache.  You have severe pain in your abdomen, your back, or the area between your waist and hips (pelvis).  You have chest pain, shortness of breath, or an irregular or fast heartbeat.  You are unable to urinate, or you urinate less than normal.  You have abnormal bleeding, such as bleeding from the rectum, vagina, nose, lungs, or nipples.  You vomit blood.  You have thoughts about hurting yourself or others. If you ever feel like you may hurt yourself or others, or have thoughts about taking your own life, get help right away. You can go to your nearest emergency department or call:  Your local emergency services (911 in the [place]  A suicide crisis helpline, such as the [facility] at 9518392821. This is open 24 hours a day. Summary  If you have fatigue, you feel tired all the time and have a lack of energy or a lack of motivation.  Fatigue may make it difficult to start or complete tasks because of exhaustion.  Long-lasting (chronic) or extreme fatigue may be a symptom of a medical condition.  Exercise regularly, as told by your health care provider.  Change situations  that cause you stress. Try to keep your work and personal schedule in balance. This information is not intended to replace advice given to you by your health care provider. Make sure you discuss any questions you have with your health care provider. Document Revised: 04/10/2019 Document Reviewed: 06/14/2017 Elsevier Patient Education  2021 [employer].

## 2020-11-11 NOTE — Progress Notes (Signed)
Established patient visit   Patient: Travis Mahoney   DOB: 28-Jan-1966   55 y.o. Male  MRN: [id number] Visit Date: 11/11/2020  Today's healthcare provider: Trinna Post, PA-C   Chief Complaint  Patient presents with  . Fatigue  I,Kierstin January M Amahri Dengel,acting as a scribe for Trinna Post, PA-C.,have documented all relevant documentation on the behalf of Trinna Post, PA-C,as directed by  Trinna Post, PA-C while in the presence of Trinna Post, PA-C.  Subjective    HPI  Fatigue Patient presents today for fatigue for about 1 month now. Patient reports that he gets about 5-6 hours of sleep a night. He works 8 hour shifts at work. Drinks coffee several times a day. He has obstructive sleep apnea and has not been used his CPAP machine for 6 months. He finds it difficult to breathe out against pressure. No new medicines. Diagnosed with COVID on 10/12/2020 and was asymptomatic. No coughing, SOB, fevers, chills.   BP Readings from Last 4 Encounters:  11/11/20 (!) 132/97  09/25/19 129/85  10/11/18 122/83  05/25/18 116/76     Wt Readings from Last 3 Encounters:  11/11/20 206 lb 8 oz (93.7 kg)  09/25/19 204 lb (92.5 kg)  10/11/18 212 lb (96.2 kg)   The 10-year ASCVD risk score Mikey Bussing DC Jr., et al., 2013) is: 7.4%   Values used to calculate the score:     Age: 41 years     Sex: Male     Is Non-Hispanic African American: Yes     Diabetic: No     Tobacco smoker: No     Systolic Blood Pressure: 268 mmHg     Is BP treated: No     HDL Cholesterol: 45 mg/dL     Total Cholesterol: 214 mg/dL      Medications: Outpatient Medications Prior to Visit  Medication Sig  . tamsulosin (FLOMAX) 0.4 MG CAPS capsule Take 1 capsule (0.4 mg total) by mouth daily.   No facility-administered medications prior to visit.    Review of Systems     Objective    BP (!) 132/97 (BP Location: Right Arm, Patient Position: Sitting, Cuff Size: Large)   Pulse 84   Temp 98.6 F (37  C) (Oral)   Ht 5\' 9"  (1.753 m)   Wt 206 lb 8 oz (93.7 kg)   SpO2 98%   BMI 30.49 kg/m     Physical Exam Constitutional:      Appearance: Normal appearance. He is obese.  Cardiovascular:     Rate and Rhythm: Normal rate and regular rhythm.     Heart sounds: Normal heart sounds.  Pulmonary:     Effort: Pulmonary effort is normal.     Breath sounds: Normal breath sounds.  Skin:    General: Skin is warm and dry.  Neurological:     General: No focal deficit present.     Mental Status: He is alert and oriented to person, place, and time. Mental status is at baseline.  Psychiatric:        Mood and Affect: Mood normal.        Behavior: Behavior normal.       Results for orders placed or performed in visit on 11/11/20  TSH  Result Value Ref Range   TSH 2.480 0.450 - 4.500 uIU/mL  Lipid panel  Result Value Ref Range   Cholesterol, Total 214 (H) 100 - 199 mg/dL   Triglycerides 125 0 -  149 mg/dL   HDL 45 >39 mg/dL   VLDL Cholesterol Cal 23 5 - 40 mg/dL   LDL Chol Calc (NIH) 146 (H) 0 - 99 mg/dL   Chol/HDL Ratio 4.8 0.0 - 5.0 ratio  Comprehensive metabolic panel  Result Value Ref Range   Glucose 88 65 - 99 mg/dL   BUN 14 6 - 24 mg/dL   Creatinine, Ser 1.14 0.76 - 1.27 mg/dL   GFR calc non Af Amer 72 >59 mL/min/1.73   GFR calc Af Amer 83 >59 mL/min/1.73   BUN/Creatinine Ratio 12 9 - 20   Sodium 137 134 - 144 mmol/L   Potassium 4.2 3.5 - 5.2 mmol/L   Chloride 102 96 - 106 mmol/L   CO2 21 20 - 29 mmol/L   Calcium 9.9 8.7 - 10.2 mg/dL   Total Protein 7.5 6.0 - 8.5 g/dL   Albumin 5.0 (H) 3.8 - 4.9 g/dL   Globulin, Total 2.5 1.5 - 4.5 g/dL   Albumin/Globulin Ratio 2.0 1.2 - 2.2   Bilirubin Total 0.4 0.0 - 1.2 mg/dL   Alkaline Phosphatase 82 44 - 121 IU/L   AST 30 0 - 40 IU/L   ALT 27 0 - 44 IU/L  CBC with Differential/Platelet  Result Value Ref Range   WBC 5.3 3.4 - 10.8 x10E3/uL   RBC 5.23 4.14 - 5.80 x10E6/uL   Hemoglobin 16.1 13.0 - 17.7 g/dL   Hematocrit 46.9 37.5  - 51.0 %   MCV 90 79 - 97 fL   MCH 30.8 26.6 - 33.0 pg   MCHC 34.3 31.5 - 35.7 g/dL   RDW 11.7 11.6 - 15.4 %   Platelets 176 150 - 450 x10E3/uL   Neutrophils 55 Not Estab. %   Lymphs 29 Not Estab. %   Monocytes 13 Not Estab. %   Eos 2 Not Estab. %   Basos 1 Not Estab. %   Neutrophils Absolute 2.9 1.4 - 7.0 x10E3/uL   Lymphocytes Absolute 1.5 0.7 - 3.1 x10E3/uL   Monocytes Absolute 0.7 0.1 - 0.9 x10E3/uL   EOS (ABSOLUTE) 0.1 0.0 - 0.4 x10E3/uL   Basophils Absolute 0.0 0.0 - 0.2 x10E3/uL   Immature Granulocytes 0 Not Estab. %   Immature Grans (Abs) 0.0 0.0 - 0.1 x10E3/uL    Assessment & Plan    1. Sleep apnea, unspecified type  Suspect his fatigue is due to not using CPAP. Advised him to call sleep company and ask about steps to get bipap which he may tolerate better.   2. History of COVID-19   3. Other fatigue  - TSH - Lipid panel - Comprehensive metabolic panel - CBC with Differential/Platelet  4. Hyperlipidemia  ASCVD risk 7.4%. Recommend statin.   No follow-ups on file.      ITrinna Post, PA-C, have reviewed all documentation for this visit. The documentation on 11/13/20 for the exam, diagnosis, procedures, and orders are all accurate and complete.  The entirety of the information documented in the History of Present Illness, Review of Systems and Physical Exam were personally obtained by me. Portions of this information were initially documented by [facility] and reviewed by me for thoroughness and accuracy.     Paulene Floor  [facility] [phone] (phone) [phone] (fax)  [place]

## 2020-11-12 LAB — LIPID PANEL
Chol/HDL Ratio: 4.8 ratio (ref 0.0–5.0)
Cholesterol, Total: 214 mg/dL — ABNORMAL HIGH (ref 100–199)
HDL: 45 mg/dL (ref >39)
LDL Chol Calc (NIH): 146 mg/dL — ABNORMAL HIGH (ref 0–99)
Triglycerides: 125 mg/dL (ref 0–149)
VLDL Cholesterol Cal: 23 mg/dL (ref 5–40)

## 2020-11-12 LAB — CBC WITH DIFFERENTIAL/PLATELET
Basophils Absolute: 0 10*3/uL (ref 0.0–0.2)
Basos: 1 %
EOS (ABSOLUTE): 0.1 10*3/uL (ref 0.0–0.4)
Eos: 2 %
Hematocrit: 46.9 % (ref 37.5–51.0)
Hemoglobin: 16.1 g/dL (ref 13.0–17.7)
Immature Grans (Abs): 0 10*3/uL (ref 0.0–0.1)
Immature Granulocytes: 0 %
Lymphocytes Absolute: 1.5 10*3/uL (ref 0.7–3.1)
Lymphs: 29 %
MCH: 30.8 pg (ref 26.6–33.0)
MCHC: 34.3 g/dL (ref 31.5–35.7)
MCV: 90 fL (ref 79–97)
Monocytes Absolute: 0.7 10*3/uL (ref 0.1–0.9)
Monocytes: 13 %
Neutrophils Absolute: 2.9 10*3/uL (ref 1.4–7.0)
Neutrophils: 55 %
Platelets: 176 10*3/uL (ref 150–450)
RBC: 5.23 x10E6/uL (ref 4.14–5.80)
RDW: 11.7 % (ref 11.6–15.4)
WBC: 5.3 10*3/uL (ref 3.4–10.8)

## 2020-11-12 LAB — COMPREHENSIVE METABOLIC PANEL
ALT: 27 IU/L (ref 0–44)
AST: 30 IU/L (ref 0–40)
Albumin/Globulin Ratio: 2 (ref 1.2–2.2)
Albumin: 5 g/dL — ABNORMAL HIGH (ref 3.8–4.9)
Alkaline Phosphatase: 82 IU/L (ref 44–121)
BUN/Creatinine Ratio: 12 (ref 9–20)
BUN: 14 mg/dL (ref 6–24)
Bilirubin Total: 0.4 mg/dL (ref 0.0–1.2)
CO2: 21 mmol/L (ref 20–29)
Calcium: 9.9 mg/dL (ref 8.7–10.2)
Chloride: 102 mmol/L (ref 96–106)
Creatinine, Ser: 1.14 mg/dL (ref 0.76–1.27)
GFR calc Af Amer: 83 mL/min/{1.73_m2} (ref >59)
GFR calc non Af Amer: 72 mL/min/{1.73_m2} (ref >59)
Globulin, Total: 2.5 g/dL (ref 1.5–4.5)
Glucose: 88 mg/dL (ref 65–99)
Potassium: 4.2 mmol/L (ref 3.5–5.2)
Sodium: 137 mmol/L (ref 134–144)
Total Protein: 7.5 g/dL (ref 6.0–8.5)

## 2020-11-12 LAB — TSH: TSH: 2.48 u[IU]/mL (ref 0.450–4.500)

## 2020-11-13 ENCOUNTER — Telehealth: Payer: Self-pay

## 2020-11-13 NOTE — Telephone Encounter (Signed)
-----   Message from Trinna Post, [place] sent at 11/13/2020  2:49 PM EST ----- Labs normal except for cholesterol.  Your cholesterol was elevated and shows you have an 7.4% chance over the next ten years of having a heart attack or stroke. When your risk is this high, we recommend starting a cholesterol medication like Lipitor to lower your chances of having these issues.

## 2020-11-13 NOTE — Telephone Encounter (Signed)
Called patient and no answer, LVMTRC. If patient calls back okay for PEC to advise of message. 

## 2020-11-25 NOTE — Telephone Encounter (Signed)
Called number on file and was incorrect number for patient. Send a letter to patient to call the office.

## 2020-12-04 ENCOUNTER — Telehealth: Payer: Self-pay | Admitting: *Deleted

## 2020-12-04 NOTE — Telephone Encounter (Signed)
Pt called back saying he is interested in taking the Lipitor.  [facility] [employer]

## 2020-12-04 NOTE — Telephone Encounter (Signed)
Pt given results and recommendations per [name], "Labs normal except for cholesterol. Your cholesterol was elevated and shows you have an 7.4% chance over the next ten years of having a heart attack or stroke. When your risk is this high, we recommend starting a cholesterol medication like Lipitor to lower your chances of having these issues"; he verbalized understanding and would to discuss this with his provider; he would also like to discuss changing his eating habits instead of starting medication; the pt can be contacted at [phone]; will route to office for provider review.

## 2020-12-07 NOTE — Telephone Encounter (Signed)
Yes he can adjust his eating habits and f/u 3 months for recheck.

## 2020-12-08 NOTE — Telephone Encounter (Signed)
Patient was advised and reports he will work on lifestyle changes. He going to call back to schedule a 3 month f/u.

## 2020-12-18 ENCOUNTER — Telehealth: Payer: Self-pay

## 2020-12-18 DIAGNOSIS — Z1211 Encounter for screening for malignant neoplasm of colon: Secondary | ICD-10-CM

## 2020-12-18 NOTE — Telephone Encounter (Signed)
Referral placed.

## 2020-12-18 NOTE — Telephone Encounter (Signed)
Copied from [place] [phone]. Topic: Referral - Request for Referral >> Dec 17, 2020  4:06 PM Alanda Slim E wrote: Has patient seen PCP for this complaint? No  *If NO, is insurance requiring patient see PCP for this issue before PCP can refer them? Referral for which specialty: Gertie Fey  Preferred provider/office: perfers [place] office and then [facility] Reason for referral: Pt is due for colonoscopy

## 2020-12-18 NOTE — Telephone Encounter (Signed)
Called patient and no answer, so left a vm that referral was placed.

## 2020-12-31 ENCOUNTER — Telehealth: Payer: Self-pay

## 2020-12-31 DIAGNOSIS — E7800 Pure hypercholesterolemia, unspecified: Secondary | ICD-10-CM

## 2020-12-31 MED ORDER — ATORVASTATIN CALCIUM 20 MG PO TABS: 20.0000 mg | ORAL_TABLET | Freq: Every day | ORAL | 3 refills | Status: DC

## 2020-12-31 NOTE — Telephone Encounter (Signed)
Atorvastatin sent in

## 2020-12-31 NOTE — Telephone Encounter (Signed)
Please advise. Patient was last seen by [name], PA-C on 11/11/2020 for Hyperlipidemia. Per office note [name] documented that ASCVD risk 7.4%, and she recommended starting a statin. Patient is calling requesting to start a medication to help lower cholesterol.

## 2020-12-31 NOTE — Telephone Encounter (Signed)
Copied from [place] [phone]. Topic: General - Other >> Dec 31, 2020  9:27 AM Tessa Lerner A wrote: Reason for CRM: Patient would like to be contacted to further discuss a cholesterol medication  Patient has discussed the medication previously with former PCP and has an interest in being prescribed the medication  Please contact to further advise when possible

## 2021-01-05 ENCOUNTER — Emergency Department
Admission: EM | Admit: 2021-01-05 | Discharge: 2021-01-05 | Disposition: A | Discharge status: Home / Self Care | Payer: PRIVATE HEALTH INSURANCE | Attending: Emergency Medicine | Admitting: Emergency Medicine

## 2021-01-05 ENCOUNTER — Other Ambulatory Visit: Payer: Self-pay

## 2021-01-05 ENCOUNTER — Emergency Department: Payer: PRIVATE HEALTH INSURANCE

## 2021-01-05 DIAGNOSIS — Z79899 Other long term (current) drug therapy: Secondary | ICD-10-CM | POA: Insufficient documentation

## 2021-01-05 DIAGNOSIS — R42 Dizziness and giddiness: Secondary | ICD-10-CM | POA: Diagnosis not present

## 2021-01-05 DIAGNOSIS — R509 Fever, unspecified: Secondary | ICD-10-CM | POA: Diagnosis not present

## 2021-01-05 DIAGNOSIS — R5383 Other fatigue: Secondary | ICD-10-CM | POA: Diagnosis not present

## 2021-01-05 DIAGNOSIS — R519 Headache, unspecified: Secondary | ICD-10-CM | POA: Diagnosis not present

## 2021-01-05 DIAGNOSIS — M2550 Pain in unspecified joint: Secondary | ICD-10-CM

## 2021-01-05 HISTORY — DX: Hyperlipidemia, unspecified: E78.5

## 2021-01-05 LAB — URINALYSIS, COMPLETE (UACMP) WITH MICROSCOPIC
Bacteria, UA: NONE SEEN
Bilirubin Urine: NEGATIVE
Glucose, UA: NEGATIVE mg/dL
Hgb urine dipstick: NEGATIVE
Ketones, ur: NEGATIVE mg/dL
Leukocytes,Ua: NEGATIVE
Nitrite: NEGATIVE
Protein, ur: NEGATIVE mg/dL
Specific Gravity, Urine: 1.01 (ref 1.005–1.030)
Squamous Epithelial / HPF: NONE SEEN (ref 0–5)
pH: 7 (ref 5.0–8.0)

## 2021-01-05 LAB — CBC
HCT: 43.6 % (ref 39.0–52.0)
Hemoglobin: 15.2 g/dL (ref 13.0–17.0)
MCH: 30.6 pg (ref 26.0–34.0)
MCHC: 34.9 g/dL (ref 30.0–36.0)
MCV: 87.7 fL (ref 80.0–100.0)
Platelets: 203 10*3/uL (ref 150–400)
RBC: 4.97 MIL/uL (ref 4.22–5.81)
RDW: 11 % — ABNORMAL LOW (ref 11.5–15.5)
WBC: 4.8 10*3/uL (ref 4.0–10.5)
nRBC: 0 % (ref 0.0–0.2)

## 2021-01-05 LAB — BASIC METABOLIC PANEL
Anion gap: 6 (ref 5–15)
BUN: 9 mg/dL (ref 6–20)
CO2: 27 mmol/L (ref 22–32)
Calcium: 9.1 mg/dL (ref 8.9–10.3)
Chloride: 99 mmol/L (ref 98–111)
Creatinine, Ser: 1.06 mg/dL (ref 0.61–1.24)
GFR, Estimated: >60 mL/min (ref >60)
Glucose, Bld: 110 mg/dL — ABNORMAL HIGH (ref 70–99)
Potassium: 4 mmol/L (ref 3.5–5.1)
Sodium: 132 mmol/L — ABNORMAL LOW (ref 135–145)

## 2021-01-05 LAB — TROPONIN I (HIGH SENSITIVITY): Troponin I (High Sensitivity): 3 ng/L (ref <18)

## 2021-01-05 LAB — CBG MONITORING, ED: Glucose-Capillary: 99 mg/dL (ref 70–99)

## 2021-01-05 LAB — TSH: TSH: 1.446 u[IU]/mL (ref 0.350–4.500)

## 2021-01-05 NOTE — ED Notes (Signed)
Patient transported to CT 

## 2021-01-05 NOTE — ED Provider Notes (Signed)
[facility] Emergency Department Provider Note   ____________________________________________   Event Date/Time   First MD Initiated Contact with Patient 01/05/21 [phone]     (approximate)  I have reviewed the triage vital signs and the nursing notes.   HISTORY  Chief Complaint Dizziness and Near Syncope    HPI Travis Mahoney is a 55 y.o. male with past medical history of OSA and BPH who presents to the ED complaining of dizziness and fatigue.  Patient reports that for the past month and a half he has been dealing with increasing fatigue, now has a hard time staying awake after getting home from work.  This is associated with intermittent episodes of lightheadedness and feeling like he is going to pass out.  He denies any feelings of the room spinning around him and has not had any numbness or weakness in his extremities.  He does endorse intermittent headaches along with pain in the joints of both hands.  He has felt feverish at times, checked his temperature and at one point had a fever of 100.5, but has not had any more fevers recently.  He denies any cough, chest pain, shortness of breath, abdominal pain, nausea, vomiting, dysuria, or hematuria.        Past Medical History:  Diagnosis Date  . Allergy   . ED (erectile dysfunction)   . Enlarged prostate   . Hyperlipemia   . Sleep apnea     Patient Active Problem List   Diagnosis Date Noted  . Benign prostatic hyperplasia 05/25/2018    Past Surgical History:  Procedure Laterality Date  . COLONOSCOPY WITH PROPOFOL N/A 10/20/2017   Procedure: COLONOSCOPY WITH PROPOFOL;  Surgeon: Jonathon Bellows, MD;  Location: [facility] ENDOSCOPY;  Service: Gastroenterology;  Laterality: N/A;  . VASECTOMY      Prior to Admission medications   Medication Sig Start Date End Date Taking? Authorizing Provider  atorvastatin (LIPITOR) 20 MG tablet Take 1 tablet (20 mg total) by mouth daily. 12/31/20   Mar Daring, PA-C   tamsulosin (FLOMAX) 0.4 MG CAPS capsule Take 1 capsule (0.4 mg total) by mouth daily. 10/14/20   Trinna Post, PA-C    Allergies Penicillins and Shellfish allergy  Family History  Problem Relation Age of Onset  . Other Father        enlarged prostate  . Diabetes Father   . Hypertension Mother     Social History Social History   Tobacco Use  . Smoking status: Never Smoker  . Smokeless tobacco: Never Used  Vaping Use  . Vaping Use: Never used  Substance Use Topics  . Alcohol use: Yes    Alcohol/week: 4.0 standard drinks    Types: 4 Cans of beer per week  . Drug use: No    Review of Systems  Constitutional: Positive for fever/chills.  Positive for fatigue. Eyes: No visual changes. ENT: No sore throat. Cardiovascular: Denies chest pain.  Positive for dizziness and lightheadedness. Respiratory: Denies shortness of breath. Gastrointestinal: No abdominal pain.  No nausea, no vomiting.  No diarrhea.  No constipation. Genitourinary: Negative for dysuria. Musculoskeletal: Negative for back pain.  Positive for arthralgias. Skin: Negative for rash. Neurological: Negative for headaches, focal weakness or numbness.  ____________________________________________   PHYSICAL EXAM:  VITAL SIGNS: ED Triage Vitals  Enc Vitals Group     BP 01/05/21 0815 (!) 125/97     Pulse Rate 01/05/21 0815 93     Resp 01/05/21 0815 18  Temp 01/05/21 0815 98.3 F (36.8 C)     Temp Source 01/05/21 0815 Oral     SpO2 01/05/21 0815 99 %     Weight 01/05/21 0817 200 lb (90.7 kg)     Height 01/05/21 0817 5\' 9"  (1.753 m)     Head Circumference --      Peak Flow --      Pain Score 01/05/21 0816 7     Pain Loc --      Pain Edu? --      Excl. in [place]? --     Constitutional: Alert and oriented. Eyes: Conjunctivae are normal.  Pupils equal round and reactive to light bilaterally. Head: Atraumatic. Nose: No congestion/rhinnorhea. Mouth/Throat: Mucous membranes are moist. Neck: Normal  ROM Cardiovascular: Normal rate, regular rhythm. Grossly normal heart sounds. Respiratory: Normal respiratory effort.  No retractions. Lungs CTAB. Gastrointestinal: Soft and nontender. No distention. Genitourinary: deferred Musculoskeletal: No lower extremity tenderness nor edema. Neurologic:  Normal speech and language. No gross focal neurologic deficits are appreciated. Skin:  Skin is warm, dry and intact. No rash noted. Psychiatric: Mood and affect are normal. Speech and behavior are normal.  ____________________________________________   LABS (all labs ordered are listed, but only abnormal results are displayed)  Labs Reviewed  BASIC METABOLIC PANEL - Abnormal; Notable for the following components:      Result Value   Sodium 132 (*)    Glucose, Bld 110 (*)    All other components within normal limits  CBC - Abnormal; Notable for the following components:   RDW 11.0 (*)    All other components within normal limits  URINALYSIS, COMPLETE (UACMP) WITH MICROSCOPIC - Abnormal; Notable for the following components:   Color, Urine YELLOW (*)    APPearance CLEAR (*)    All other components within normal limits  TSH  CBG MONITORING, ED  TROPONIN I (HIGH SENSITIVITY)   ____________________________________________  EKG  ED ECG REPORT I, Blake Divine, the attending physician, personally viewed and interpreted this ECG.   Date: 01/05/2021  EKG Time: 8:16  Rate: 92  Rhythm: normal sinus rhythm  Axis: RAD  Intervals:none  ST&T Change: None   PROCEDURES  Procedure(s) performed (including Critical Care):  Procedures   ____________________________________________   INITIAL IMPRESSION / ASSESSMENT AND PLAN / ED COURSE       55 year old male with past medical history of OSA and BPH who presents to the ED with increasing fatigue over the past month and a half with intermittent episodes of lightheadedness, headache, subjective fevers, and arthralgias.  Patient is  well-appearing here in the ED with reassuring vital signs, will check chest x-ray and UA for infectious process but no evidence of sepsis.  He has no focal neurologic deficits on exam and describes the dizziness as lightheadedness rather than vertigo, low suspicion for stroke.  We will check labs for anemia, electrode abnormality, hypothyroidism.  We will also check CT head.  Chest x-ray reviewed by me and shows no infiltrate, edema, or effusion.  UA also shows no signs of infection.  CT head is negative for acute process and additional labs are also reassuring, TSH within normal limits.  Patient is appropriate for discharge home and will be provided with rheumatology follow-up along with PCP.  He was counseled to return to the ED for new worsening symptoms, patient agrees with plan.      ____________________________________________   FINAL CLINICAL IMPRESSION(S) / ED DIAGNOSES  Final diagnoses:  Fatigue, unspecified type  Arthralgia, unspecified  joint  Lightheadedness     ED Discharge Orders    None       Note:  This document was prepared using Dragon voice recognition software and may include unintentional dictation errors.   Blake Divine, MD 01/05/21 1022

## 2021-01-05 NOTE — ED Notes (Signed)
Patient advised this has been going on x 1 month and knows he should have been seen earlier. Denies a PCP. He was using [facility] but his PCP quit the facility. Patient recently diagnosed with hyperlipidemia and prescribed medication which he started yesterday.

## 2021-01-05 NOTE — ED Triage Notes (Signed)
Pt arrives to ed via pov, ambulatory to triage with steady gait. Pt c/o dizziness starting around 1.5 months ago but has progressively getting worse, and feels like he is going to pass out if he is straining to do anything. Pt reports he feels fatigued. Pain/tighteness in hands starting a month ago, now in wrist starting around 3 days ago. Denies any vision changes. NAD noted at this time

## 2021-01-20 ENCOUNTER — Encounter: Payer: Self-pay | Admitting: *Deleted

## 2021-02-03 ENCOUNTER — Other Ambulatory Visit: Payer: Self-pay

## 2021-02-25 ENCOUNTER — Other Ambulatory Visit: Payer: Self-pay

## 2021-02-25 ENCOUNTER — Ambulatory Visit: Payer: Self-pay | Admitting: Physician Assistant

## 2021-02-25 VITALS — BP 119/72 | HR 99 | Temp 97.6°F | Resp 14 | Ht 69.0 in | Wt 203.0 lb

## 2021-02-25 DIAGNOSIS — W460XXA Contact with hypodermic needle, initial encounter: Secondary | ICD-10-CM | POA: Insufficient documentation

## 2021-02-25 NOTE — Progress Notes (Signed)
   Subjective: Needlestick    Patient ID: Travis Mahoney, male    DOB: 11-30-1965, 55 y.o.   MRN: [id number]  HPI Patient sustained a needlestick to the palmar left hand at work.  Patient states no bleeding.  Denies loss sensation or loss of function.  Patient immunizations are up-to-date.   Review of Systems BPH and hyperlipidemia    Objective:   Physical Exam No acute distress.  Temperature 97.6, pulse 99, respiration 14, BP 119/72, and patient is 96% O2 sat on room air.  Examination of the left palm reveals a minute puncture wound consistent with a 25-30 gauge needle.     Assessment & Plan: Needlestick  Advised patient needlestick protocol per OSHA requirements.  Patient initial blood work done today.  Patient will follow-up per OSHA requirements for further evaluation.  Advised return back if noted any signs and symptoms secondary infection.

## 2021-02-26 LAB — HEPATITIS B SURFACE ANTIBODY,QUALITATIVE: Hep B Surface Ab, Qual: REACTIVE

## 2021-02-26 LAB — HIV ANTIBODY (ROUTINE TESTING W REFLEX): HIV Screen 4th Generation wRfx: NONREACTIVE

## 2021-02-26 LAB — HCV INTERPRETATION

## 2021-02-26 LAB — HCV AB W REFLEX TO QUANT PCR: HCV Ab: <0.1 s/co ratio (ref 0.0–0.9)

## 2021-03-03 ENCOUNTER — Ambulatory Visit: Payer: Self-pay

## 2021-03-10 ENCOUNTER — Other Ambulatory Visit: Payer: Self-pay

## 2021-03-10 ENCOUNTER — Observation Stay: Payer: 59

## 2021-03-10 ENCOUNTER — Inpatient Hospital Stay
Admission: EM | Admit: 2021-03-10 | Discharge: 2021-03-13 | DRG: 872 | Disposition: A | Discharge status: Home / Self Care | Payer: 59 | Attending: Internal Medicine | Admitting: Internal Medicine

## 2021-03-10 DIAGNOSIS — R39198 Other difficulties with micturition: Secondary | ICD-10-CM | POA: Diagnosis not present

## 2021-03-10 DIAGNOSIS — A4151 Sepsis due to Escherichia coli [E. coli]: Principal | ICD-10-CM | POA: Diagnosis present

## 2021-03-10 DIAGNOSIS — N3289 Other specified disorders of bladder: Secondary | ICD-10-CM | POA: Diagnosis not present

## 2021-03-10 DIAGNOSIS — Z79899 Other long term (current) drug therapy: Secondary | ICD-10-CM

## 2021-03-10 DIAGNOSIS — E785 Hyperlipidemia, unspecified: Secondary | ICD-10-CM | POA: Diagnosis present

## 2021-03-10 DIAGNOSIS — N390 Urinary tract infection, site not specified: Secondary | ICD-10-CM | POA: Diagnosis present

## 2021-03-10 DIAGNOSIS — R42 Dizziness and giddiness: Secondary | ICD-10-CM | POA: Diagnosis not present

## 2021-03-10 DIAGNOSIS — N400 Enlarged prostate without lower urinary tract symptoms: Secondary | ICD-10-CM | POA: Diagnosis present

## 2021-03-10 DIAGNOSIS — R1084 Generalized abdominal pain: Secondary | ICD-10-CM | POA: Diagnosis not present

## 2021-03-10 DIAGNOSIS — K3189 Other diseases of stomach and duodenum: Secondary | ICD-10-CM | POA: Diagnosis not present

## 2021-03-10 DIAGNOSIS — Z880 Allergy status to penicillin: Secondary | ICD-10-CM

## 2021-03-10 DIAGNOSIS — E872 Acidosis: Secondary | ICD-10-CM | POA: Diagnosis present

## 2021-03-10 DIAGNOSIS — N3000 Acute cystitis without hematuria: Secondary | ICD-10-CM | POA: Diagnosis not present

## 2021-03-10 DIAGNOSIS — A419 Sepsis, unspecified organism: Secondary | ICD-10-CM | POA: Diagnosis present

## 2021-03-10 DIAGNOSIS — R000 Tachycardia, unspecified: Secondary | ICD-10-CM | POA: Diagnosis not present

## 2021-03-10 DIAGNOSIS — B9620 Unspecified Escherichia coli [E. coli] as the cause of diseases classified elsewhere: Secondary | ICD-10-CM | POA: Diagnosis present

## 2021-03-10 DIAGNOSIS — R509 Fever, unspecified: Secondary | ICD-10-CM | POA: Diagnosis not present

## 2021-03-10 DIAGNOSIS — Z20822 Contact with and (suspected) exposure to covid-19: Secondary | ICD-10-CM | POA: Diagnosis present

## 2021-03-10 DIAGNOSIS — R6520 Severe sepsis without septic shock: Secondary | ICD-10-CM | POA: Diagnosis present

## 2021-03-10 DIAGNOSIS — R404 Transient alteration of awareness: Secondary | ICD-10-CM | POA: Diagnosis not present

## 2021-03-10 DIAGNOSIS — Z91013 Allergy to seafood: Secondary | ICD-10-CM

## 2021-03-10 LAB — URINALYSIS, COMPLETE (UACMP) WITH MICROSCOPIC
Bilirubin Urine: NEGATIVE
Glucose, UA: NEGATIVE mg/dL
Ketones, ur: NEGATIVE mg/dL
Nitrite: POSITIVE — AB
Protein, ur: 100 mg/dL — AB
RBC / HPF: >50 RBC/hpf — ABNORMAL HIGH (ref 0–5)
Specific Gravity, Urine: 1.018 (ref 1.005–1.030)
WBC, UA: >50 WBC/hpf — ABNORMAL HIGH (ref 0–5)
pH: 9 — ABNORMAL HIGH (ref 5.0–8.0)

## 2021-03-10 LAB — CBC WITH DIFFERENTIAL/PLATELET
Abs Immature Granulocytes: 0.08 10*3/uL — ABNORMAL HIGH (ref 0.00–0.07)
Basophils Absolute: 0 10*3/uL (ref 0.0–0.1)
Basophils Relative: 0 %
Eosinophils Absolute: 0 10*3/uL (ref 0.0–0.5)
Eosinophils Relative: 0 %
HCT: 41.7 % (ref 39.0–52.0)
Hemoglobin: 14.7 g/dL (ref 13.0–17.0)
Immature Granulocytes: 1 %
Lymphocytes Relative: 5 %
Lymphs Abs: 0.8 10*3/uL (ref 0.7–4.0)
MCH: 31.3 pg (ref 26.0–34.0)
MCHC: 35.3 g/dL (ref 30.0–36.0)
MCV: 88.7 fL (ref 80.0–100.0)
Monocytes Absolute: 1.3 10*3/uL — ABNORMAL HIGH (ref 0.1–1.0)
Monocytes Relative: 8 %
Neutro Abs: 13.9 10*3/uL — ABNORMAL HIGH (ref 1.7–7.7)
Neutrophils Relative %: 86 %
Platelets: 186 10*3/uL (ref 150–400)
RBC: 4.7 MIL/uL (ref 4.22–5.81)
RDW: 11.8 % (ref 11.5–15.5)
WBC: 16.1 10*3/uL — ABNORMAL HIGH (ref 4.0–10.5)
nRBC: 0 % (ref 0.0–0.2)

## 2021-03-10 LAB — PROTIME-INR
INR: 1 (ref 0.8–1.2)
Prothrombin Time: 13.3 seconds (ref 11.4–15.2)

## 2021-03-10 LAB — COMPREHENSIVE METABOLIC PANEL
ALT: 22 U/L (ref 0–44)
AST: 26 U/L (ref 15–41)
Albumin: 4.2 g/dL (ref 3.5–5.0)
Alkaline Phosphatase: 58 U/L (ref 38–126)
Anion gap: 6 (ref 5–15)
BUN: 11 mg/dL (ref 6–20)
CO2: 25 mmol/L (ref 22–32)
Calcium: 8.8 mg/dL — ABNORMAL LOW (ref 8.9–10.3)
Chloride: 102 mmol/L (ref 98–111)
Creatinine, Ser: 1.02 mg/dL (ref 0.61–1.24)
GFR, Estimated: >60 mL/min (ref >60)
Glucose, Bld: 127 mg/dL — ABNORMAL HIGH (ref 70–99)
Potassium: 3.8 mmol/L (ref 3.5–5.1)
Sodium: 133 mmol/L — ABNORMAL LOW (ref 135–145)
Total Bilirubin: 1.5 mg/dL — ABNORMAL HIGH (ref 0.3–1.2)
Total Protein: 7.1 g/dL (ref 6.5–8.1)

## 2021-03-10 LAB — SARS CORONAVIRUS 2 (TAT 6-24 HRS): SARS Coronavirus 2: NEGATIVE

## 2021-03-10 LAB — LACTIC ACID, PLASMA
Lactic Acid, Venous: 1.9 mmol/L (ref 0.5–1.9)
Lactic Acid, Venous: 4.1 mmol/L (ref 0.5–1.9)

## 2021-03-10 MED ORDER — SODIUM CHLORIDE 0.9 % IV SOLN
1.0000 g | INTRAVENOUS | Status: DC
  Administered 2021-03-10 – 2021-03-12 (×3): 1 g via INTRAVENOUS
  Filled 2021-03-10: qty 1
  Filled 2021-03-10 (×4): qty 10

## 2021-03-10 MED ORDER — ACETAMINOPHEN 325 MG PO TABS: 650.0000 mg | ORAL_TABLET | Freq: Four times a day (QID) | ORAL | Status: DC | PRN

## 2021-03-10 MED ORDER — CIPROFLOXACIN IN D5W 400 MG/200ML IV SOLN
400.0000 mg | Freq: Once | INTRAVENOUS | Status: AC
  Administered 2021-03-10: 400 mg via INTRAVENOUS
  Filled 2021-03-10: qty 200

## 2021-03-10 MED ORDER — SODIUM CHLORIDE 0.9 % IV SOLN
1000.0000 mL | Freq: Once | INTRAVENOUS | Status: AC
  Administered 2021-03-10: 1000 mL via INTRAVENOUS

## 2021-03-10 MED ORDER — ACETAMINOPHEN 650 MG RE SUPP: 650.0000 mg | Freq: Four times a day (QID) | RECTAL | Status: DC | PRN

## 2021-03-10 MED ORDER — ONDANSETRON HCL 4 MG/2ML IJ SOLN: 4.0000 mg | Freq: Four times a day (QID) | INTRAMUSCULAR | Status: DC | PRN

## 2021-03-10 MED ORDER — ONDANSETRON HCL 4 MG PO TABS: 4.0000 mg | ORAL_TABLET | Freq: Four times a day (QID) | ORAL | Status: DC | PRN

## 2021-03-10 MED ORDER — ENOXAPARIN SODIUM 40 MG/0.4ML IJ SOSY
40.0000 mg | PREFILLED_SYRINGE | INTRAMUSCULAR | Status: DC
  Administered 2021-03-10 – 2021-03-12 (×3): 40 mg via SUBCUTANEOUS
  Filled 2021-03-10 (×3): qty 0.4

## 2021-03-10 MED ORDER — LACTATED RINGERS IV SOLN: INTRAVENOUS | Status: DC

## 2021-03-10 MED ORDER — ACETAMINOPHEN 500 MG PO TABS
1000.0000 mg | ORAL_TABLET | Freq: Once | ORAL | Status: AC
  Administered 2021-03-10: 1000 mg via ORAL
  Filled 2021-03-10: qty 2

## 2021-03-10 MED ORDER — TAMSULOSIN HCL 0.4 MG PO CAPS
0.4000 mg | ORAL_CAPSULE | Freq: Every day | ORAL | Status: DC
  Administered 2021-03-11: 10:00:00 0.4 mg via ORAL
  Filled 2021-03-10 (×3): qty 1

## 2021-03-10 MED ORDER — ATORVASTATIN CALCIUM 20 MG PO TABS
20.0000 mg | ORAL_TABLET | Freq: Every day | ORAL | Status: DC
  Administered 2021-03-11: 20 mg via ORAL
  Filled 2021-03-10 (×3): qty 1

## 2021-03-10 NOTE — ED Notes (Signed)
Bladder scan showed 113cc

## 2021-03-10 NOTE — ED Notes (Signed)
Bladder scan showed 12ml

## 2021-03-10 NOTE — ED Notes (Signed)
Redraw blue and light green sent to lab at this time.

## 2021-03-10 NOTE — ED Provider Notes (Signed)
[facility] Emergency Department Provider Note   ____________________________________________    I have reviewed the triage vital signs and the nursing notes.   HISTORY  Chief Complaint Urinary Frequency, Abdominal Pain, and Fever     HPI Travis Mahoney is a 55 y.o. male with a history of enlarged prostate who presents with complaints of fever chills, difficulty urinating, dizziness.  Patient reports this started 2 days ago.  He felt lightheaded at work and had chills.  Has had some cramping in the lower abdomen.  Has not take anything for this.  No history of UTI.  Does take medication for prostatic hypertrophy    Past Medical History:  Diagnosis Date  . Allergy   . ED (erectile dysfunction)   . Enlarged prostate   . Hyperlipemia   . Sleep apnea     Patient Active Problem List   Diagnosis Date Noted  . Needle stick, hypodermic, accidental, initial encounter 02/25/2021  . Benign prostatic hyperplasia 05/25/2018    Past Surgical History:  Procedure Laterality Date  . COLONOSCOPY WITH PROPOFOL N/A 10/20/2017   Procedure: COLONOSCOPY WITH PROPOFOL;  Surgeon: Jonathon Bellows, MD;  Location: [facility] ENDOSCOPY;  Service: Gastroenterology;  Laterality: N/A;  . VASECTOMY      Prior to Admission medications   Medication Sig Start Date End Date Taking? Authorizing Provider  atorvastatin (LIPITOR) 20 MG tablet Take 1 tablet (20 mg total) by mouth daily. 12/31/20   Mar Daring, PA-C  tamsulosin (FLOMAX) 0.4 MG CAPS capsule Take 1 capsule (0.4 mg total) by mouth daily. 10/14/20   Trinna Post, PA-C     Allergies Penicillins and Shellfish allergy  Family History  Problem Relation Age of Onset  . Other Father        enlarged prostate  . Diabetes Father   . Hypertension Mother     Social History Social History   Tobacco Use  . Smoking status: Never Smoker  . Smokeless tobacco: Never Used  Vaping Use  . Vaping Use: Never used   Substance Use Topics  . Alcohol use: Yes    Alcohol/week: 4.0 standard drinks    Types: 4 Cans of beer per week  . Drug use: No    Review of Systems  Constitutional: Positive chills Eyes: No visual changes.  ENT: No sore throat. Cardiovascular: Denies chest pain. Respiratory: Denies shortness of breath. Gastrointestinal: As above Genitourinary: As above Musculoskeletal: Negative for back pain. Skin: Negative for rash. Neurological: Negative for headaches or weakness   ____________________________________________   PHYSICAL EXAM:  VITAL SIGNS: ED Triage Vitals [03/10/21 1344]  Enc Vitals Group     BP (!) 155/131     Pulse Rate (!) 108     Resp 18     Temp (!) 102.3 F (39.1 C)     Temp Source Oral     SpO2 97 %     Weight 90.7 kg (200 lb)     Height 1.753 m (5\' 9" )     Head Circumference      Peak Flow      Pain Score 10     Pain Loc      Pain Edu?      Excl. in [place]?     Constitutional: Alert and oriented.  Eyes: Conjunctivae are normal.  Head: Atraumatic. Nose: No congestion/rhinnorhea. Mouth/Throat: Mucous membranes are moist.   Neck:  Painless ROM Cardiovascular: Tachycardia, regular rhythm. Grossly normal heart sounds.  Good peripheral circulation. Respiratory:  Normal respiratory effort.  No retractions. Lungs CTAB. Gastrointestinal: Soft and nontender. No distention.  No CVA tenderness. Genitourinary: deferred Musculoskeletal: No lower extremity tenderness nor edema.  Warm and well perfused Neurologic:  Normal speech and language. No gross focal neurologic deficits are appreciated.  Skin:  Skin is warm, dry and intact. No rash noted. Psychiatric: Mood and affect are normal. Speech and behavior are normal.  ____________________________________________   LABS (all labs ordered are listed, but only abnormal results are displayed)  Labs Reviewed  CBC WITH DIFFERENTIAL/PLATELET - Abnormal; Notable for the following components:      Result Value    WBC 16.1 (*)    Neutro Abs 13.9 (*)    Monocytes Absolute 1.3 (*)    Abs Immature Granulocytes 0.08 (*)    All other components within normal limits  URINALYSIS, COMPLETE (UACMP) WITH MICROSCOPIC - Abnormal; Notable for the following components:   Color, Urine YELLOW (*)    APPearance CLOUDY (*)    pH 9.0 (*)    Hgb urine dipstick MODERATE (*)    Protein, ur 100 (*)    Nitrite POSITIVE (*)    Leukocytes,Ua LARGE (*)    RBC / HPF >50 (*)    WBC, UA >50 (*)    Bacteria, UA MANY (*)    Non Squamous Epithelial PRESENT (*)    All other components within normal limits  COMPREHENSIVE METABOLIC PANEL - Abnormal; Notable for the following components:   Sodium 133 (*)    Glucose, Bld 127 (*)    Calcium 8.8 (*)    Total Bilirubin 1.5 (*)    All other components within normal limits  CULTURE, BLOOD (ROUTINE X 2)  CULTURE, BLOOD (ROUTINE X 2)  LACTIC ACID, PLASMA  PROTIME-INR  LACTIC ACID, PLASMA   ____________________________________________  EKG  None ____________________________________________  RADIOLOGY  None ____________________________________________   PROCEDURES  Procedure(s) performed: No  Procedures   Critical Care performed: No ____________________________________________   INITIAL IMPRESSION / ASSESSMENT AND PLAN / ED COURSE  Pertinent labs & imaging results that were available during my care of the patient were reviewed by me and considered in my medical decision making (see chart for details).  Patient presents with fever, tachycardia, elevated white blood cell count of 16,000 and evidence of urinary tract infection on urinalysis.  Does meet SIRS criteria for sepsis.  Will treat with IV fluids, IV Cipro as the patient has a penicillin allergy that is significant.  Will require admission for further treatment.  Borderline lactic acid.    ____________________________________________   FINAL CLINICAL IMPRESSION(S) / ED DIAGNOSES  Final diagnoses:   Sepsis without acute organ dysfunction, due to unspecified organism [facility])        Note:  This document was prepared using Dragon voice recognition software and may include unintentional dictation errors.   Lavonia Drafts, MD 03/10/21 [phone]

## 2021-03-10 NOTE — ED Triage Notes (Signed)
Pt c/o of straining to urinate, painful urination, enlarged prostate, painful testicles, lower abd pain, nausea, dizziness, fever. A&O, in wheelchair.   Arrives EMS from home. Symptoms began yesterday. VSS with EMS except temp 102.1. CBG 144. Arrives with 20g IV to L FA with NS running.

## 2021-03-10 NOTE — H&P (Signed)
History and Physical    Travis Mahoney:[id number] DOB: [birth year] DOA: 03/10/2021  PCP: [place]   Patient coming from: Home  I have personally briefly reviewed patient's old medical records in [place]  Chief Complaint: Abdominal pain  HPI: Travis Mahoney is a 55 y.o. male with medical history significant for BPH who presents to the ER for evaluation of 3 day history of frequent urination, straining, urgency and dysuria.  He also complains of pain in his lower abdomen mostly in the suprapubic area which he rates 6 x 10 in intensity at its worst.  Symptoms associated with fever, nausea and dizziness. Per EMS patient had a fever with a T-max of 102.46F.  Upon arrival to the ER his temp was 100.3F. He denies having any chest pain, no shortness of breath, no changes in his bowel habits, no headache, no lower extremity swelling, no palpitations, no diaphoresis, no lightheadedness, no lower extremity swelling, claudication, no headache, no focal deficits. Labs show sodium 133, potassium 3.8, chloride 102, bicarb 25, glucose 127, BUN 11, creatinine 1.02, calcium 8.8, alkaline phosphatase 58, albumin 4.2, AST 26, ALT 22, total protein 7.1, total bilirubin 1.5, lactic acid 1.9, white count 16.1, hemoglobin 14.7, hematocrit 41.7, MCV 88.7, RDW 11.8, platelet count 186, PT 13.3, INR 1.0    ED Course: Patient is a 55 year old African-American male who presents to the emergency room for evaluation of abdominal pain mostly in the lower abdominal area (suprapubic and right lower quadrant) associated with urinary symptoms, fever and tachycardia.  He has a white count of 16,000 with pyuria and a lactic acid level of 1.9. He will be admitted to the hospital for further evaluation.  Review of Systems: As per HPI otherwise all other systems reviewed and negative.    Past Medical History:  Diagnosis Date  . Allergy   . ED (erectile dysfunction)   . Enlarged prostate   .  Hyperlipemia   . Sleep apnea     Past Surgical History:  Procedure Laterality Date  . COLONOSCOPY WITH PROPOFOL N/A 10/20/2017   Procedure: COLONOSCOPY WITH PROPOFOL;  Surgeon: Jonathon Bellows, MD;  Location: [facility] ENDOSCOPY;  Service: Gastroenterology;  Laterality: N/A;  . VASECTOMY       reports that he has never smoked. He has never used smokeless tobacco. He reports current alcohol use of about 4.0 standard drinks of alcohol per week. He reports that he does not use drugs.  Allergies  Allergen Reactions  . Penicillins Itching and Swelling  . Shellfish Allergy     Family History  Problem Relation Age of Onset  . Other Father        enlarged prostate  . Diabetes Father   . Hypertension Mother       Prior to Admission medications   Medication Sig Start Date End Date Taking? Authorizing Provider  atorvastatin (LIPITOR) 20 MG tablet Take 1 tablet (20 mg total) by mouth daily. 12/31/20  Yes Mar Daring, PA-C  meloxicam (MOBIC) 15 MG tablet Take 15 mg by mouth daily. 03/03/21  Yes [provider]  tamsulosin (FLOMAX) 0.4 MG CAPS capsule Take 1 capsule (0.4 mg total) by mouth daily. 10/14/20  Yes Trinna Post, PA-C    Physical Exam: Vitals:   03/10/21 1344 03/10/21 1451  BP: (!) 155/131   Pulse: (!) 108   Resp: 18   Temp: (!) 102.3 F (39.1 C) (!) 100.5 F (38.1 C)  TempSrc: Oral Oral  SpO2: 97%  Weight: 90.7 kg   Height: 5\' 9"  (1.753 m)      Vitals:   03/10/21 1344 03/10/21 1451  BP: (!) 155/131   Pulse: (!) 108   Resp: 18   Temp: (!) 102.3 F (39.1 C) (!) 100.5 F (38.1 C)  TempSrc: Oral Oral  SpO2: 97%   Weight: 90.7 kg   Height: 5\' 9"  (1.753 m)       Constitutional: Alert and oriented x 3 . Not in any apparent distress HEENT:      Head: Normocephalic and atraumatic.         Eyes: PERLA, EOMI, Conjunctivae are normal. Sclera is non-icteric.       Mouth/Throat: Mucous membranes are moist.       Neck: Supple with no signs of  meningismus. Cardiovascular: Tachycardia. No murmurs, gallops, or rubs. 2+ symmetrical distal pulses are present . No JVD. No LE edema Respiratory: Respiratory effort normal .Lungs sounds clear bilaterally. No wheezes, crackles, or rhonchi.  Gastrointestinal: Soft, right lower quadrant and suprapubic tenderness, and non distended with positive bowel sounds.  Genitourinary: Lt CVA tenderness. Musculoskeletal: Nontender with normal range of motion in all extremities. No cyanosis, or erythema of extremities. Neurologic:  Face is symmetric. Moving all extremities. No gross focal neurologic deficits . Skin: Skin is warm, dry.  No rash or ulcers Psychiatric: Mood and affect are normal   Labs on Admission: I have personally reviewed following labs and imaging studies  CBC: Recent Labs  Lab 03/10/21 1346  WBC 16.1*  NEUTROABS 13.9*  HGB 14.7  HCT 41.7  MCV 88.7  PLT 177   Basic Metabolic Panel: Recent Labs  Lab 03/10/21 1407  NA 133*  K 3.8  CL 102  CO2 25  GLUCOSE 127*  BUN 11  CREATININE 1.02  CALCIUM 8.8*   GFR: Estimated Creatinine Clearance: 91.1 mL/min (by C-G formula based on SCr of 1.02 mg/dL). Liver Function Tests: Recent Labs  Lab 03/10/21 1407  AST 26  ALT 22  ALKPHOS 58  BILITOT 1.5*  PROT 7.1  ALBUMIN 4.2   No results for input(s): LIPASE, AMYLASE in the last 168 hours. No results for input(s): AMMONIA in the last 168 hours. Coagulation Profile: Recent Labs  Lab 03/10/21 1407  INR 1.0   Cardiac Enzymes: No results for input(s): CKTOTAL, CKMB, CKMBINDEX, TROPONINI in the last 168 hours. BNP (last 3 results) No results for input(s): PROBNP in the last 8760 hours. HbA1C: No results for input(s): HGBA1C in the last 72 hours. CBG: No results for input(s): GLUCAP in the last 168 hours. Lipid Profile: No results for input(s): CHOL, HDL, LDLCALC, TRIG, CHOLHDL, LDLDIRECT in the last 72 hours. Thyroid Function Tests: No results for input(s): TSH,  T4TOTAL, FREET4, T3FREE, THYROIDAB in the last 72 hours. Anemia Panel: No results for input(s): VITAMINB12, FOLATE, FERRITIN, TIBC, IRON, RETICCTPCT in the last 72 hours. Urine analysis:    Component Value Date/Time   COLORURINE YELLOW (A) 03/10/2021 1346   APPEARANCEUR CLOUDY (A) 03/10/2021 1346   LABSPEC 1.018 03/10/2021 1346   PHURINE 9.0 (H) 03/10/2021 1346   GLUCOSEU NEGATIVE 03/10/2021 1346   HGBUR MODERATE (A) 03/10/2021 1346   BILIRUBINUR NEGATIVE 03/10/2021 1346   KETONESUR NEGATIVE 03/10/2021 1346   PROTEINUR 100 (A) 03/10/2021 1346   NITRITE POSITIVE (A) 03/10/2021 1346   LEUKOCYTESUR LARGE (A) 03/10/2021 1346    Radiological Exams on Admission: No results found.   Assessment/Plan Active Problems:   Benign prostatic hyperplasia   UTI (urinary tract  infection)   Sepsis ([place])     Sepsis from a urinary source As evidenced by fever with a T-max of 102F, tachycardia, marked leukocytosis, pyuria and lactic acidosis. We will place patient on Rocephin 1 g IV daily Follow-up results of blood and culture   BPH Continue Flomax   DVT prophylaxis: Lovenox Code Status: full code Family Communication: Greater than 50% of time was spent discussing patient's condition and plan of care with him at the bedside.  All questions and concerns have been addressed.  He verbalizes understanding and agrees with the plan. Disposition Plan: Back to previous home environment Consults called: none Status: Observation    Gracious Renken MD [facility]     03/10/2021, 3:43 PM

## 2021-03-11 ENCOUNTER — Encounter: Payer: Self-pay | Admitting: Internal Medicine

## 2021-03-11 DIAGNOSIS — N400 Enlarged prostate without lower urinary tract symptoms: Secondary | ICD-10-CM

## 2021-03-11 DIAGNOSIS — Z79899 Other long term (current) drug therapy: Secondary | ICD-10-CM | POA: Diagnosis not present

## 2021-03-11 DIAGNOSIS — E785 Hyperlipidemia, unspecified: Secondary | ICD-10-CM | POA: Diagnosis present

## 2021-03-11 DIAGNOSIS — N3000 Acute cystitis without hematuria: Secondary | ICD-10-CM | POA: Diagnosis not present

## 2021-03-11 DIAGNOSIS — Z91013 Allergy to seafood: Secondary | ICD-10-CM | POA: Diagnosis not present

## 2021-03-11 DIAGNOSIS — N3001 Acute cystitis with hematuria: Secondary | ICD-10-CM

## 2021-03-11 DIAGNOSIS — E872 Acidosis: Secondary | ICD-10-CM | POA: Diagnosis present

## 2021-03-11 DIAGNOSIS — N390 Urinary tract infection, site not specified: Secondary | ICD-10-CM

## 2021-03-11 DIAGNOSIS — A419 Sepsis, unspecified organism: Secondary | ICD-10-CM | POA: Diagnosis not present

## 2021-03-11 DIAGNOSIS — N401 Enlarged prostate with lower urinary tract symptoms: Secondary | ICD-10-CM | POA: Diagnosis not present

## 2021-03-11 DIAGNOSIS — A4151 Sepsis due to Escherichia coli [E. coli]: Secondary | ICD-10-CM | POA: Diagnosis present

## 2021-03-11 DIAGNOSIS — Z880 Allergy status to penicillin: Secondary | ICD-10-CM | POA: Diagnosis not present

## 2021-03-11 DIAGNOSIS — Z20822 Contact with and (suspected) exposure to covid-19: Secondary | ICD-10-CM | POA: Diagnosis present

## 2021-03-11 DIAGNOSIS — B9620 Unspecified Escherichia coli [E. coli] as the cause of diseases classified elsewhere: Secondary | ICD-10-CM | POA: Diagnosis present

## 2021-03-11 DIAGNOSIS — R6520 Severe sepsis without septic shock: Secondary | ICD-10-CM

## 2021-03-11 LAB — BASIC METABOLIC PANEL
Anion gap: 6 (ref 5–15)
BUN: 10 mg/dL (ref 6–20)
CO2: 26 mmol/L (ref 22–32)
Calcium: 8.7 mg/dL — ABNORMAL LOW (ref 8.9–10.3)
Chloride: 104 mmol/L (ref 98–111)
Creatinine, Ser: 1.09 mg/dL (ref 0.61–1.24)
GFR, Estimated: >60 mL/min (ref >60)
Glucose, Bld: 110 mg/dL — ABNORMAL HIGH (ref 70–99)
Potassium: 3.8 mmol/L (ref 3.5–5.1)
Sodium: 136 mmol/L (ref 135–145)

## 2021-03-11 LAB — CBC
HCT: 39.2 % (ref 39.0–52.0)
Hemoglobin: 13.8 g/dL (ref 13.0–17.0)
MCH: 31 pg (ref 26.0–34.0)
MCHC: 35.2 g/dL (ref 30.0–36.0)
MCV: 88.1 fL (ref 80.0–100.0)
Platelets: 180 10*3/uL (ref 150–400)
RBC: 4.45 MIL/uL (ref 4.22–5.81)
RDW: 11.9 % (ref 11.5–15.5)
WBC: 11.2 10*3/uL — ABNORMAL HIGH (ref 4.0–10.5)
nRBC: 0 % (ref 0.0–0.2)

## 2021-03-11 LAB — PROCALCITONIN: Procalcitonin: 0.14 ng/mL

## 2021-03-11 LAB — PROTIME-INR
INR: 1.2 (ref 0.8–1.2)
Prothrombin Time: 14.7 seconds (ref 11.4–15.2)

## 2021-03-11 LAB — LACTIC ACID, PLASMA: Lactic Acid, Venous: 0.8 mmol/L (ref 0.5–1.9)

## 2021-03-11 LAB — CORTISOL-AM, BLOOD: Cortisol - AM: 10.3 ug/dL (ref 6.7–22.6)

## 2021-03-11 NOTE — Plan of Care (Signed)
Pt orientedx4, VSS. Reports feeling much better than prior. LR infusing per MAR. Independent to bathroom. Safety precautions in place, rounding performed, needs/concerns addressed.  Problem: Education: Goal: Knowledge of General Education information will improve Description: Including pain rating scale, medication(s)/side effects and non-pharmacologic comfort measures Outcome: Progressing   Problem: Health Behavior/Discharge Planning: Goal: Ability to manage health-related needs will improve Outcome: Progressing   Problem: Clinical Measurements: Goal: Ability to maintain clinical measurements within normal limits will improve Outcome: Progressing Goal: Will remain free from infection Outcome: Progressing Goal: Diagnostic test results will improve Outcome: Progressing Goal: Respiratory complications will improve Outcome: Progressing Goal: Cardiovascular complication will be avoided Outcome: Progressing   Problem: Activity: Goal: Risk for activity intolerance will decrease Outcome: Progressing   Problem: Nutrition: Goal: Adequate nutrition will be maintained Outcome: Progressing   Problem: Coping: Goal: Level of anxiety will decrease Outcome: Progressing   Problem: Elimination: Goal: Will not experience complications related to bowel motility Outcome: Progressing Goal: Will not experience complications related to urinary retention Outcome: Progressing   Problem: Pain Managment: Goal: General experience of comfort will improve Outcome: Progressing   Problem: Safety: Goal: Ability to remain free from injury will improve Outcome: Progressing   Problem: Skin Integrity: Goal: Risk for impaired skin integrity will decrease Outcome: Progressing

## 2021-03-11 NOTE — Progress Notes (Addendum)
Progress Note    Travis Mahoney  JAS:[id number] DOB: 09/30/1966  DOA: 03/10/2021 PCP: [facility]      Brief Narrative:    Medical records reviewed and are as summarized below:  Travis Mahoney is a 55 y.o. male history significant for BPH who presented to the hospital with a 3-day history of increased frequency of micturition, urgency, straining at urination and dysuria.  He also complained of abdominal pain, fever, nausea and dizziness that started about 1 day prior to admission.  He was febrile with T-max of 102.1 F in the ED and tachycardic.  He was admitted to the hospital for severe sepsis secondary to acute UTI.    Assessment/Plan:   Principal Problem:   Severe sepsis (HCC) Active Problems:   Benign prostatic hyperplasia   UTI (urinary tract infection)    Body mass index is 29.53 kg/m.   Severe sepsis secondary to UTI: Lactic acid was 4.1.  Continue IV Rocephin.  Analgesics as needed for pain.  Follow-up urine and blood cultures.  BPH: Continue Flomax.    Diet Order             Diet 2 gram sodium Room service appropriate? Yes; Fluid consistency: Thin  Diet effective now                      Consultants: None  Procedures: None    Medications:    atorvastatin  20 mg Oral Daily   enoxaparin (LOVENOX) injection  40 mg Subcutaneous Q24H   tamsulosin  0.4 mg Oral Daily   Continuous Infusions:  cefTRIAXone (ROCEPHIN)  IV Stopped (03/10/21 2234)     Anti-infectives (From admission, onward)    Start     Dose/Rate Route Frequency Ordered Stop   03/10/21 2200  cefTRIAXone (ROCEPHIN) 1 g in sodium chloride 0.9 % 100 mL IVPB        1 g 200 mL/hr over 30 Minutes Intravenous Every 24 hours 03/10/21 1541     03/10/21 1430  ciprofloxacin (CIPRO) IVPB 400 mg        400 mg 200 mL/hr over 60 Minutes Intravenous  Once 03/10/21 1428 03/10/21 1556              Family Communication/Anticipated D/C date and plan/Code  Status   DVT prophylaxis: enoxaparin (LOVENOX) injection 40 mg Start: 03/10/21 2200     Code Status: Full Code  Family Communication: None Disposition Plan:    Status is: Observation  The patient will require care spanning > 2 midnights and should be moved to inpatient because: IV treatments appropriate due to intensity of illness or inability to take PO  Dispo: The patient is from: Home              Anticipated d/c is to: Home              Patient currently is not medically stable to d/c.   Difficult to place patient No           Subjective:   C/o increased frequency and urgency of micturition, mild dysuria.  Abdominal pain is improved.  No nausea, vomiting or diarrhea.  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    Objective:    Vitals:   03/11/21 0015 03/11/21 0520 03/11/21 0813 03/11/21 1147  BP: 106/67 118/68 129/84 121/83  Pulse: 83 82 80 83  Resp: 16 16 16 16   Temp: 99.4 F (37.4 C) 99.1 F (37.3 C) 98.7 F (37.1 C) 98.7 F (37.1 C)  TempSrc: Oral Oral Oral Oral  SpO2: 99% 98% 98% 98%  Weight:      Height:       No data found.   Intake/Output Summary (Last 24 hours) at 03/11/2021 1247 Last data filed at 03/11/2021 1044 Gross per 24 hour  Intake 1370.06 ml  Output --  Net 1370.06 ml   Filed Weights   03/10/21 1344  Weight: 90.7 kg    Exam:  GEN: NAD SKIN: No rash EYES: EOMI ENT: MMM CV: RRR PULM: CTA B ABD: soft, ND, NT, +BS CNS: AAO x 3, non focal EXT: No edema or tenderness GU: No CVA tenderness       Data Reviewed:   I have personally reviewed following labs and imaging studies:  Labs: Labs show the following:   Basic Metabolic Panel: Recent Labs  Lab 03/10/21 1407 03/11/21 0344  NA 133* 136  K 3.8 3.8  CL 102 104  CO2 25 26  GLUCOSE 127* 110*  BUN 11 10  CREATININE 1.02 1.09  CALCIUM 8.8*  8.7*   GFR Estimated Creatinine Clearance: 85.2 mL/min (by C-G formula based on SCr of 1.09 mg/dL). Liver Function Tests: Recent Labs  Lab 03/10/21 1407  AST 26  ALT 22  ALKPHOS 58  BILITOT 1.5*  PROT 7.1  ALBUMIN 4.2   No results for input(s): LIPASE, AMYLASE in the last 168 hours. No results for input(s): AMMONIA in the last 168 hours. Coagulation profile Recent Labs  Lab 03/10/21 1407 03/11/21 0344  INR 1.0 1.2    CBC: Recent Labs  Lab 03/10/21 1346 03/11/21 0344  WBC 16.1* 11.2*  NEUTROABS 13.9*  --   HGB 14.7 13.8  HCT 41.7 39.2  MCV 88.7 88.1  PLT 186 180   Cardiac Enzymes: No results for input(s): CKTOTAL, CKMB, CKMBINDEX, TROPONINI in the last 168 hours. BNP (last 3 results) No results for input(s): PROBNP in the last 8760 hours. CBG: No results for input(s): GLUCAP in the last 168 hours. D-Dimer: No results for input(s): DDIMER in the last 72 hours. Hgb A1c: No results for input(s): HGBA1C in the last 72 hours. Lipid Profile: No results for input(s): CHOL, HDL, LDLCALC, TRIG, CHOLHDL, LDLDIRECT in the last 72 hours. Thyroid function studies: No results for input(s): TSH, T4TOTAL, T3FREE, THYROIDAB in the last 72 hours.  Invalid input(s): FREET3 Anemia work up: No results for input(s): VITAMINB12, FOLATE, FERRITIN, TIBC, IRON, RETICCTPCT in the last 72 hours. Sepsis Labs: Recent Labs  Lab 03/10/21 1346 03/10/21 1546 03/11/21 0344  PROCALCITON  --   --  0.14  WBC 16.1*  --  11.2*  LATICACIDVEN 1.9 4.1*  --     Microbiology Recent Results (from the past 240 hour(s))  Culture, blood (Routine x 2)     Status: None (Preliminary result)   Collection Time: 03/10/21  1:46 PM   Specimen: BLOOD LEFT FOREARM  Result Value Ref Range Status   Specimen Description BLOOD LEFT FOREARM  Final   Special Requests   Final    BOTTLES DRAWN AEROBIC AND ANAEROBIC Blood Culture adequate volume   Culture   Final    NO GROWTH < 24 HOURS Performed at [facility], [address], [place], [place] [address]    Report Status PENDING  Incomplete  Culture, blood (Routine x 2)     Status: None (Preliminary result)   Collection Time: 03/10/21  2:53 PM   Specimen: BLOOD  Result Value Ref Range Status   Specimen Description BLOOD BLOOD RIGHT WRIST  Final   Special Requests   Final    BOTTLES DRAWN AEROBIC AND ANAEROBIC Blood Culture results may not be optimal due to an inadequate volume of blood received in culture bottles   Culture   Final    NO GROWTH < 24 HOURS Performed at [facility], [address] [place], [place] [address]    Report Status PENDING  Incomplete  SARS CORONAVIRUS 2 (TAT 6-24 HRS) Nasopharyngeal Nasopharyngeal Swab     Status: None   Collection Time: 03/10/21  3:58 PM   Specimen: Nasopharyngeal Swab  Result Value Ref Range Status   SARS Coronavirus 2 NEGATIVE NEGATIVE Final    Comment: (NOTE) SARS-CoV-2 target nucleic acids are NOT DETECTED.  The SARS-CoV-2 RNA is generally detectable in upper and lower respiratory specimens during the acute phase of infection. Negative results do not preclude SARS-CoV-2 infection, do not rule out co-infections with other pathogens, and should not be used as the sole basis for treatment or other patient management decisions. Negative results must be combined with clinical observations, patient history, and epidemiological information. The expected result is Negative.  Fact Sheet for Patients: SugarRoll.be  Fact Sheet for Healthcare Providers: https://www.woods-mathews.com/  This test is not yet approved or cleared by the [place] FDA and  has been authorized for detection and/or diagnosis of SARS-CoV-2 by FDA under an Emergency Use Authorization (EUA). This EUA will remain  in effect (meaning this test can be used) for the duration of the COVID-19 declaration under Se ction 564(b)(1) of the Act, 21 U.S.C. section  360bbb-3(b)(1), unless the authorization is terminated or revoked sooner.  Performed at [facility], [place] [address], [place], [place] [address]     Procedures and diagnostic studies:  CT ABDOMEN PELVIS WO CONTRAST  Result Date: 03/10/2021 CLINICAL DATA:  Provided history of: Appendicitis suspected, high risk, no prior imaging (Ped 0-18y) Clinical notes state 55 year old with fever, chills, and difficulty urinating. EXAM: CT ABDOMEN AND PELVIS WITHOUT CONTRAST TECHNIQUE: Multidetector CT imaging of the abdomen and pelvis was performed following the standard protocol without IV contrast. COMPARISON:  CT 10/09/2017 FINDINGS: Lower chest: No acute airspace disease or pleural effusion. Heart size is normal. Hepatobiliary: No focal liver abnormality is seen. No gallstones, gallbladder wall thickening, or biliary dilatation. Pancreas: No ductal dilatation or inflammation. Spleen: Normal in size without focal abnormality. Adrenals/Urinary Tract: Normal adrenal glands. Slight prominence of the right renal pelvis and ureter with mild periureteric stranding. No renal or ureteral calculi. No evidence of intrarenal collection or focal renal lesion. Unremarkable unenhanced appearance of the left kidney. Partially distended urinary bladder with diffuse bladder wall thickening and mild perivesicular edema. Bladder wall thickening is most prominent about the bladder base. Stomach/Bowel: Normal appendix, series 2, image 45. No appendicitis. Decompressed stomach. Unremarkable small bowel. No obstruction or inflammation. Majority of the colon is decompressed. No colonic wall thickening. Vascular/Lymphatic: Normal caliber abdominal aorta. There is no portal venous or mesenteric gas. No bulky abdominopelvic adenopathy. Reproductive: Prominent prostate spans 4.4 cm transverse. Other: No free air, free fluid, or intra-abdominal fluid collection. No abdominal wall hernia. Musculoskeletal: Degenerative disc disease at L3-L4  and L4-L5. There are no acute or suspicious osseous abnormalities. IMPRESSION: 1. Normal appendix. 2. Diffuse bladder wall thickening and  mild perivesicular edema, suspicious for cystitis. Slight prominence of the right renal pelvis and ureter with mild periureteric stranding, can be seen with ascending urinary tract infection. 3. Prominent prostate. Electronically Signed   By: Keith Rake M.D.   On: 03/10/2021 15:46               LOS: 0 days   Cyani Kallstrom  [facility]   Pager on [email or web address]. If 7PM-7AM, please contact night-coverage at [email or web address]     03/11/2021, 12:47 PM

## 2021-03-12 DIAGNOSIS — R3916 Straining to void: Secondary | ICD-10-CM

## 2021-03-12 DIAGNOSIS — N401 Enlarged prostate with lower urinary tract symptoms: Secondary | ICD-10-CM

## 2021-03-12 NOTE — Progress Notes (Signed)
Progress Note    Travis Mahoney  MGQ:[id number] DOB: 1965/11/27  DOA: 03/10/2021 PCP: [facility]      Brief Narrative:    Medical records reviewed and are as summarized below:  Travis Mahoney is a 55 y.o. male history significant for BPH who presented to the hospital with a 3-day history of increased frequency of micturition, urgency, straining at urination and dysuria.  He also complained of abdominal pain, fever, nausea and dizziness that started about 1 day prior to admission.  He was febrile with T-max of 102.1 F in the ED and tachycardic.  He was admitted to the hospital for severe sepsis secondary to acute UTI.    Assessment/Plan:   Principal Problem:   Severe sepsis (HCC) Active Problems:   Benign prostatic hyperplasia   UTI (urinary tract infection)    Body mass index is 29.53 kg/m.   Severe E. coli sepsis secondary to E. coli UTI: Lactic acidosis has resolved.  Urine culture is growing E. coli.  Follow-up culture sensitivity report.  Continue IV Rocephin.  Analgesics as needed for pain.    BPH: Continue Flomax.  Outpatient follow-up with urologist recommended.    Diet Order             Diet 2 gram sodium Room service appropriate? Yes; Fluid consistency: Thin  Diet effective now                      Consultants: None  Procedures: None    Medications:    atorvastatin  20 mg Oral Daily   enoxaparin (LOVENOX) injection  40 mg Subcutaneous Q24H   tamsulosin  0.4 mg Oral Daily   Continuous Infusions:  cefTRIAXone (ROCEPHIN)  IV Stopped (03/11/21 2314)     Anti-infectives (From admission, onward)    Start     Dose/Rate Route Frequency Ordered Stop   03/10/21 2200  cefTRIAXone (ROCEPHIN) 1 g in sodium chloride 0.9 % 100 mL IVPB        1 g 200 mL/hr over 30 Minutes Intravenous Every 24 hours 03/10/21 1541     03/10/21 1430  ciprofloxacin (CIPRO) IVPB 400 mg        400 mg 200 mL/hr over 60 Minutes Intravenous  Once  03/10/21 1428 03/10/21 1556              Family Communication/Anticipated D/C date and plan/Code Status   DVT prophylaxis: enoxaparin (LOVENOX) injection 40 mg Start: 03/10/21 2200     Code Status: Full Code  Family Communication: None Disposition Plan:    Status is: Inpatient  Remains inpatient appropriate because:IV treatments appropriate due to intensity of illness or inability to take PO  Dispo: The patient is from: Home              Anticipated d/c is to: Home              Patient currently is not medically stable to d/c.   Difficult to place patient No                 Subjective:   C/o difficulty passing urine and straining at micturition.    Objective:    Vitals:   03/11/21 2021 03/12/21 0017 03/12/21 0346 03/12/21 0814  BP: 118/78 115/79 105/73 120/83  Pulse: 84 72 73 68  Resp: 20 18 18 18   Temp: 99.1 F (37.3 C) 98.1 F (36.7 C) 97.9 F (36.6 C) 98.1 F (36.7  C)  TempSrc: Oral Oral Oral   SpO2: 98% 98% 96% 94%  Weight:      Height:       No data found.   Intake/Output Summary (Last 24 hours) at 03/12/2021 1109 Last data filed at 03/11/2021 2314 Gross per 24 hour  Intake 760 ml  Output --  Net 760 ml   Filed Weights   03/10/21 1344  Weight: 90.7 kg    Exam:  GEN: NAD SKIN: No rash EYES: EOMI ENT: MMM CV: RRR PULM: CTA B ABD: soft, ND, suprapubic tenderness, +BS CNS: AAO x 3, non focal EXT: No edema or tenderness    Data Reviewed:   I have personally reviewed following labs and imaging studies:  Labs: Labs show the following:   Basic Metabolic Panel: Recent Labs  Lab 03/10/21 1407 03/11/21 0344  NA 133* 136  K 3.8 3.8  CL 102 104  CO2 25 26  GLUCOSE 127* 110*  BUN 11 10  CREATININE 1.02 1.09  CALCIUM 8.8* 8.7*   GFR Estimated Creatinine Clearance: 85.2 mL/min (by C-G formula based on SCr of 1.09 mg/dL). Liver Function Tests: Recent Labs  Lab 03/10/21 1407  AST 26  ALT 22  ALKPHOS 58   BILITOT 1.5*  PROT 7.1  ALBUMIN 4.2   No results for input(s): LIPASE, AMYLASE in the last 168 hours. No results for input(s): AMMONIA in the last 168 hours. Coagulation profile Recent Labs  Lab 03/10/21 1407 03/11/21 0344  INR 1.0 1.2    CBC: Recent Labs  Lab 03/10/21 1346 03/11/21 0344  WBC 16.1* 11.2*  NEUTROABS 13.9*  --   HGB 14.7 13.8  HCT 41.7 39.2  MCV 88.7 88.1  PLT 186 180   Cardiac Enzymes: No results for input(s): CKTOTAL, CKMB, CKMBINDEX, TROPONINI in the last 168 hours. BNP (last 3 results) No results for input(s): PROBNP in the last 8760 hours. CBG: No results for input(s): GLUCAP in the last 168 hours. D-Dimer: No results for input(s): DDIMER in the last 72 hours. Hgb A1c: No results for input(s): HGBA1C in the last 72 hours. Lipid Profile: No results for input(s): CHOL, HDL, LDLCALC, TRIG, CHOLHDL, LDLDIRECT in the last 72 hours. Thyroid function studies: No results for input(s): TSH, T4TOTAL, T3FREE, THYROIDAB in the last 72 hours.  Invalid input(s): FREET3 Anemia work up: No results for input(s): VITAMINB12, FOLATE, FERRITIN, TIBC, IRON, RETICCTPCT in the last 72 hours. Sepsis Labs: Recent Labs  Lab 03/10/21 1346 03/10/21 1546 03/11/21 0344 03/11/21 1312  PROCALCITON  --   --  0.14  --   WBC 16.1*  --  11.2*  --   LATICACIDVEN 1.9 4.1*  --  0.8    Microbiology Recent Results (from the past 240 hour(s))  Culture, blood (Routine x 2)     Status: None (Preliminary result)   Collection Time: 03/10/21  1:46 PM   Specimen: BLOOD LEFT FOREARM  Result Value Ref Range Status   Specimen Description BLOOD LEFT FOREARM  Final   Special Requests   Final    BOTTLES DRAWN AEROBIC AND ANAEROBIC Blood Culture adequate volume   Culture   Final    NO GROWTH < 24 HOURS Performed at [facility], [address] [place], [place] [address]    Report Status PENDING  Incomplete  Culture, blood (Routine x 2)     Status: None (Preliminary  result)   Collection Time: 03/10/21  2:53 PM   Specimen: BLOOD  Result Value Ref Range Status  Specimen Description BLOOD BLOOD RIGHT WRIST  Final   Special Requests   Final    BOTTLES DRAWN AEROBIC AND ANAEROBIC Blood Culture results may not be optimal due to an inadequate volume of blood received in culture bottles   Culture   Final    NO GROWTH < 24 HOURS Performed at [facility], [address] [place], [place] [address]    Report Status PENDING  Incomplete  SARS CORONAVIRUS 2 (TAT 6-24 HRS) Nasopharyngeal Nasopharyngeal Swab     Status: None   Collection Time: 03/10/21  3:58 PM   Specimen: Nasopharyngeal Swab  Result Value Ref Range Status   SARS Coronavirus 2 NEGATIVE NEGATIVE Final    Comment: (NOTE) SARS-CoV-2 target nucleic acids are NOT DETECTED.  The SARS-CoV-2 RNA is generally detectable in upper and lower respiratory specimens during the acute phase of infection. Negative results do not preclude SARS-CoV-2 infection, do not rule out co-infections with other pathogens, and should not be used as the sole basis for treatment or other patient management decisions. Negative results must be combined with clinical observations, patient history, and epidemiological information. The expected result is Negative.  Fact Sheet for Patients: SugarRoll.be  Fact Sheet for Healthcare Providers: https://www.woods-mathews.com/  This test is not yet approved or cleared by the [place] FDA and  has been authorized for detection and/or diagnosis of SARS-CoV-2 by FDA under an Emergency Use Authorization (EUA). This EUA will remain  in effect (meaning this test can be used) for the duration of the COVID-19 declaration under Se ction 564(b)(1) of the Act, 21 U.S.C. section 360bbb-3(b)(1), unless the authorization is terminated or revoked sooner.  Performed at [facility], [place] [address] [place], [place] [address]    Urine Culture     Status: Abnormal (Preliminary result)   Collection Time: 03/10/21  4:36 PM   Specimen: Urine, Random  Result Value Ref Range Status   Specimen Description   Final    URINE, RANDOM Performed at [facility], [address] [place], [place] [address]    Special Requests   Final    NONE Performed at [facility], [place], [place] [address]    Culture >=100,000 COLONIES/mL ESCHERICHIA COLI (A)  Final   Report Status PENDING  Incomplete    Procedures and diagnostic studies:  CT ABDOMEN PELVIS WO CONTRAST  Result Date: 03/10/2021 CLINICAL DATA:  Provided history of: Appendicitis suspected, high risk, no prior imaging (Ped 0-18y) Clinical notes state 55 year old with fever, chills, and difficulty urinating. EXAM: CT ABDOMEN AND PELVIS WITHOUT CONTRAST TECHNIQUE: Multidetector CT imaging of the abdomen and pelvis was performed following the standard protocol without IV contrast. COMPARISON:  CT 10/09/2017 FINDINGS: Lower chest: No acute airspace disease or pleural effusion. Heart size is normal. Hepatobiliary: No focal liver abnormality is seen. No gallstones, gallbladder wall thickening, or biliary dilatation. Pancreas: No ductal dilatation or inflammation. Spleen: Normal in size without focal abnormality. Adrenals/Urinary Tract: Normal adrenal glands. Slight prominence of the right renal pelvis and ureter with mild periureteric stranding. No renal or ureteral calculi. No evidence of intrarenal collection or focal renal lesion. Unremarkable unenhanced appearance of the left kidney. Partially distended urinary bladder with diffuse bladder wall thickening and mild perivesicular edema. Bladder wall thickening is most prominent about the bladder base. Stomach/Bowel: Normal appendix, series 2, image 45. No appendicitis. Decompressed stomach. Unremarkable small bowel. No obstruction or inflammation. Majority of the colon is decompressed. No colonic wall  thickening. Vascular/Lymphatic:  Normal caliber abdominal aorta. There is no portal venous or mesenteric gas. No bulky abdominopelvic adenopathy. Reproductive: Prominent prostate spans 4.4 cm transverse. Other: No free air, free fluid, or intra-abdominal fluid collection. No abdominal wall hernia. Musculoskeletal: Degenerative disc disease at L3-L4 and L4-L5. There are no acute or suspicious osseous abnormalities. IMPRESSION: 1. Normal appendix. 2. Diffuse bladder wall thickening and mild perivesicular edema, suspicious for cystitis. Slight prominence of the right renal pelvis and ureter with mild periureteric stranding, can be seen with ascending urinary tract infection. 3. Prominent prostate. Electronically Signed   By: Keith Rake M.D.   On: 03/10/2021 15:46               LOS: 1 day   Roux Brandy  [facility]   Pager on [email or web address]. If 7PM-7AM, please contact night-coverage at [email or web address]     03/12/2021, 11:09 AM

## 2021-03-12 NOTE — TOC Progression Note (Signed)
Transition of Care [facility]) - Progression Note    Patient Details  Name: Travis Mahoney MRN: [id number] Date of Birth: 1965/11/27  Transition of Care [facility]) CM/SW [place], RN Phone Number: 03/12/2021, 3:53 PM  Clinical Narrative:   patient lives at home with wife.  Wife can assist patient if needed.  Patient has no concerns with getting to appointments or getting/taking medications.  Patient does not have home health and does not use any medical devices at home.   Patient has no concerns about going home after discharge.  He voiced a concern about frequency and straining on voiding.  He states he has told the doctor about this.  TOC informed care RN, who was already aware.  TOC contact information given if needs do arise.         Expected Discharge Plan and Services                                                 Social Determinants of Health (SDOH) Interventions    Readmission Risk Interventions No flowsheet data found.

## 2021-03-12 NOTE — Plan of Care (Signed)
Pt orientedx4, independent, VSS overnight. IV ABX given w/o issue. Continues to endorse feelings of needing to push to urinate, otherwise free of pain. Rounding performed, needs/concerns addressed overnight.   Problem: Education: Goal: Knowledge of General Education information will improve Description: Including pain rating scale, medication(s)/side effects and non-pharmacologic comfort measures Outcome: Progressing   Problem: Health Behavior/Discharge Planning: Goal: Ability to manage health-related needs will improve Outcome: Progressing   Problem: Clinical Measurements: Goal: Will remain free from infection Outcome: Progressing Goal: Diagnostic test results will improve Outcome: Progressing   Problem: Elimination: Goal: Will not experience complications related to urinary retention Outcome: Progressing   Problem: Pain Managment: Goal: General experience of comfort will improve Outcome: Progressing   Problem: Safety: Goal: Ability to remain free from injury will improve Outcome: Progressing

## 2021-03-13 LAB — URINE CULTURE: Culture: >=100000 — AB

## 2021-03-13 MED ORDER — CEPHALEXIN 500 MG PO CAPS: 500.0000 mg | ORAL_CAPSULE | Freq: Four times a day (QID) | ORAL | 0 refills | Status: AC

## 2021-03-13 MED ORDER — CEPHALEXIN 500 MG PO CAPS: 500.0000 mg | ORAL_CAPSULE | Freq: Four times a day (QID) | ORAL | 0 refills | Status: DC

## 2021-03-13 NOTE — Progress Notes (Signed)
Pt is being discharge home.  Discharge papers given and explained to pt.  Pt verbalized understanding.  Meds and f/u appointment reviewed.  Rx sent electronically to pharmacy.  Pt made aware.

## 2021-03-13 NOTE — Discharge Summary (Signed)
Physician Discharge Summary  Travis Mahoney HKV:[id number] DOB: 04/07/1966 DOA: 03/10/2021  PCP: [facility] date: 03/10/2021 Discharge date: 03/13/2021  Discharge disposition: Home   Recommendations for Outpatient Follow-Up:   Follow-up with PCP in 1 week. Follow-up with urologist in 1 to 2 weeks.   Discharge Diagnosis:   Principal Problem:   Severe sepsis (HCC) Active Problems:   Benign prostatic hyperplasia   UTI (urinary tract infection)    Discharge Condition: Stable.  Diet recommendation:  Diet Order             Diet - low sodium heart healthy           Diet 2 gram sodium Room service appropriate? Yes; Fluid consistency: Thin  Diet effective now                     Code Status: Full Code     Hospital Course:   Travis Mahoney is a 55 y.o. male history significant for BPH who presented to the hospital with a 3-day history of increased frequency of micturition, urgency, straining at urination and dysuria.  He also complained of abdominal pain, fever, nausea and dizziness that started about 1 day prior to admission.  He was febrile with T-max of 102.1 F in the ED and tachycardic.   He was admitted to the hospital for severe sepsis secondary to acute UTI.  He was treated with IV fluids and empiric IV antibiotics.  Urine culture showed E. coli.  His condition has improved and he is deemed stable for discharge to home today.  Follow-up with urologist was recommended for further evaluation of BPH with obstructive symptoms.        Discharge Exam:    Vitals:   03/12/21 2347 03/13/21 0357 03/13/21 0743 03/13/21 1319  BP: 115/86 107/75 119/87 114/76  Pulse: 66 69 73 91  Resp: 20 18 18 16   Temp: 98.5 F (36.9 C) 98.2 F (36.8 C) 97.9 F (36.6 C) 98.7 F (37.1 C)  TempSrc: Oral Oral Oral Oral  SpO2: 99% 99% 100% 99%  Weight:      Height:         GEN: NAD SKIN: No rash EYES: EOMI ENT: MMM CV: RRR PULM: CTA B ABD:  soft, ND, NT, +BS CNS: AAO x 3, non focal EXT: No edema or tenderness   The results of significant diagnostics from this hospitalization (including imaging, microbiology, ancillary and laboratory) are listed below for reference.     Procedures and Diagnostic Studies:   CT ABDOMEN PELVIS WO CONTRAST  Result Date: 03/10/2021 CLINICAL DATA:  Provided history of: Appendicitis suspected, high risk, no prior imaging (Ped 0-18y) Clinical notes state 55 year old with fever, chills, and difficulty urinating. EXAM: CT ABDOMEN AND PELVIS WITHOUT CONTRAST TECHNIQUE: Multidetector CT imaging of the abdomen and pelvis was performed following the standard protocol without IV contrast. COMPARISON:  CT 10/09/2017 FINDINGS: Lower chest: No acute airspace disease or pleural effusion. Heart size is normal. Hepatobiliary: No focal liver abnormality is seen. No gallstones, gallbladder wall thickening, or biliary dilatation. Pancreas: No ductal dilatation or inflammation. Spleen: Normal in size without focal abnormality. Adrenals/Urinary Tract: Normal adrenal glands. Slight prominence of the right renal pelvis and ureter with mild periureteric stranding. No renal or ureteral calculi. No evidence of intrarenal collection or focal renal lesion. Unremarkable unenhanced appearance of the left kidney. Partially distended urinary bladder with diffuse bladder wall thickening and mild perivesicular edema. Bladder wall  thickening is most prominent about the bladder base. Stomach/Bowel: Normal appendix, series 2, image 45. No appendicitis. Decompressed stomach. Unremarkable small bowel. No obstruction or inflammation. Majority of the colon is decompressed. No colonic wall thickening. Vascular/Lymphatic: Normal caliber abdominal aorta. There is no portal venous or mesenteric gas. No bulky abdominopelvic adenopathy. Reproductive: Prominent prostate spans 4.4 cm transverse. Other: No free air, free fluid, or intra-abdominal fluid  collection. No abdominal wall hernia. Musculoskeletal: Degenerative disc disease at L3-L4 and L4-L5. There are no acute or suspicious osseous abnormalities. IMPRESSION: 1. Normal appendix. 2. Diffuse bladder wall thickening and mild perivesicular edema, suspicious for cystitis. Slight prominence of the right renal pelvis and ureter with mild periureteric stranding, can be seen with ascending urinary tract infection. 3. Prominent prostate. Electronically Signed   By: Keith Rake M.D.   On: 03/10/2021 15:46     Labs:   Basic Metabolic Panel: Recent Labs  Lab 03/10/21 1407 03/11/21 0344  NA 133* 136  K 3.8 3.8  CL 102 104  CO2 25 26  GLUCOSE 127* 110*  BUN 11 10  CREATININE 1.02 1.09  CALCIUM 8.8* 8.7*   GFR Estimated Creatinine Clearance: 85.2 mL/min (by C-G formula based on SCr of 1.09 mg/dL). Liver Function Tests: Recent Labs  Lab 03/10/21 1407  AST 26  ALT 22  ALKPHOS 58  BILITOT 1.5*  PROT 7.1  ALBUMIN 4.2   No results for input(s): LIPASE, AMYLASE in the last 168 hours. No results for input(s): AMMONIA in the last 168 hours. Coagulation profile Recent Labs  Lab 03/10/21 1407 03/11/21 0344  INR 1.0 1.2    CBC: Recent Labs  Lab 03/10/21 1346 03/11/21 0344  WBC 16.1* 11.2*  NEUTROABS 13.9*  --   HGB 14.7 13.8  HCT 41.7 39.2  MCV 88.7 88.1  PLT 186 180   Cardiac Enzymes: No results for input(s): CKTOTAL, CKMB, CKMBINDEX, TROPONINI in the last 168 hours. BNP: Invalid input(s): POCBNP CBG: No results for input(s): GLUCAP in the last 168 hours. D-Dimer No results for input(s): DDIMER in the last 72 hours. Hgb A1c No results for input(s): HGBA1C in the last 72 hours. Lipid Profile No results for input(s): CHOL, HDL, LDLCALC, TRIG, CHOLHDL, LDLDIRECT in the last 72 hours. Thyroid function studies No results for input(s): TSH, T4TOTAL, T3FREE, THYROIDAB in the last 72 hours.  Invalid input(s): FREET3 Anemia work up No results for input(s):  VITAMINB12, FOLATE, FERRITIN, TIBC, IRON, RETICCTPCT in the last 72 hours. Microbiology Recent Results (from the past 240 hour(s))  Culture, blood (Routine x 2)     Status: None (Preliminary result)   Collection Time: 03/10/21  1:46 PM   Specimen: BLOOD LEFT FOREARM  Result Value Ref Range Status   Specimen Description BLOOD LEFT FOREARM  Final   Special Requests   Final    BOTTLES DRAWN AEROBIC AND ANAEROBIC Blood Culture adequate volume   Culture   Final    NO GROWTH 3 DAYS Performed at [facility], [address] [place], [place] [address]    Report Status PENDING  Incomplete  Culture, blood (Routine x 2)     Status: None (Preliminary result)   Collection Time: 03/10/21  2:53 PM   Specimen: BLOOD  Result Value Ref Range Status   Specimen Description BLOOD BLOOD RIGHT WRIST  Final   Special Requests   Final    BOTTLES DRAWN AEROBIC AND ANAEROBIC Blood Culture results may not be optimal due to an inadequate volume of blood received  in culture bottles   Culture   Final    NO GROWTH 3 DAYS Performed at [facility] [address] [place], [place] [address]    Report Status PENDING  Incomplete  SARS CORONAVIRUS 2 (TAT 6-24 HRS) Nasopharyngeal Nasopharyngeal Swab     Status: None   Collection Time: 03/10/21  3:58 PM   Specimen: Nasopharyngeal Swab  Result Value Ref Range Status   SARS Coronavirus 2 NEGATIVE NEGATIVE Final    Comment: (NOTE) SARS-CoV-2 target nucleic acids are NOT DETECTED.  The SARS-CoV-2 RNA is generally detectable in upper and lower respiratory specimens during the acute phase of infection. Negative results do not preclude SARS-CoV-2 infection, do not rule out co-infections with other pathogens, and should not be used as the sole basis for treatment or other patient management decisions. Negative results must be combined with clinical observations, patient history, and epidemiological information. The expected result is  Negative.  Fact Sheet for Patients: SugarRoll.be  Fact Sheet for Healthcare Providers: https://www.woods-mathews.com/  This test is not yet approved or cleared by the [place] FDA and  has been authorized for detection and/or diagnosis of SARS-CoV-2 by FDA under an Emergency Use Authorization (EUA). This EUA will remain  in effect (meaning this test can be used) for the duration of the COVID-19 declaration under Se ction 564(b)(1) of the Act, 21 U.S.C. section 360bbb-3(b)(1), unless the authorization is terminated or revoked sooner.  Performed at [facility], [place] [address] [place], [place] [address]   Urine Culture     Status: Abnormal   Collection Time: 03/10/21  4:36 PM   Specimen: Urine, Random  Result Value Ref Range Status   Specimen Description   Final    URINE, RANDOM Performed at [facility], [address] [place], [place] [address]    Special Requests   Final    NONE Performed at [facility], [place], [place] [address]    Culture >=100,000 COLONIES/mL ESCHERICHIA COLI (A)  Final   Report Status 03/13/2021 FINAL  Final   Organism ID, Bacteria ESCHERICHIA COLI (A)  Final      Susceptibility   Escherichia coli - MIC*    AMPICILLIN <=2 SENSITIVE Sensitive     CEFAZOLIN <=4 SENSITIVE Sensitive     CEFEPIME <=0.12 SENSITIVE Sensitive     CEFTRIAXONE <=0.25 SENSITIVE Sensitive     CIPROFLOXACIN <=0.25 SENSITIVE Sensitive     GENTAMICIN <=1 SENSITIVE Sensitive     IMIPENEM <=0.25 SENSITIVE Sensitive     NITROFURANTOIN <=16 SENSITIVE Sensitive     TRIMETH/SULFA <=20 SENSITIVE Sensitive     AMPICILLIN/SULBACTAM <=2 SENSITIVE Sensitive     PIP/TAZO <=4 SENSITIVE Sensitive     * >=100,000 COLONIES/mL ESCHERICHIA COLI     Discharge Instructions:   Discharge Instructions     Diet - low sodium heart healthy   Complete by: As directed    Increase activity slowly   Complete  by: As directed       Allergies as of 03/13/2021       Reactions   Penicillins Itching, Swelling   Shellfish Allergy         Medication List     TAKE these medications    atorvastatin 20 MG tablet Commonly known as: LIPITOR Take 1 tablet (20 mg total) by mouth daily.   cephALEXin 500 MG capsule Commonly known as: KEFLEX Take 1 capsule (500 mg total) by mouth 4 (four) times daily for 4 days.  meloxicam 15 MG tablet Commonly known as: MOBIC Take 15 mg by mouth daily.   tamsulosin 0.4 MG Caps capsule Commonly known as: FLOMAX Take 1 capsule (0.4 mg total) by mouth daily.        Follow-up [facility]. Schedule an appointment as soon as possible for a visit in 1 week(s).   Specialty: Urology Contact information: [place], [place] [phone]                 Time coordinating discharge: 28 minutes  Signed:  Jennye Boroughs  [facility] 03/13/2021, 4:28 PM   Pager on [email or web address]. If 7PM-7AM, please contact night-coverage at [email or web address]

## 2021-03-15 LAB — CULTURE, BLOOD (ROUTINE X 2)
Culture: NO GROWTH
Culture: NO GROWTH
Special Requests: ADEQUATE

## 2021-03-18 ENCOUNTER — Ambulatory Visit (INDEPENDENT_AMBULATORY_CARE_PROVIDER_SITE_OTHER): Payer: 59 | Admitting: Urology

## 2021-03-18 ENCOUNTER — Encounter: Payer: Self-pay | Admitting: Urology

## 2021-03-18 ENCOUNTER — Other Ambulatory Visit: Payer: Self-pay

## 2021-03-18 VITALS — BP 119/79 | HR 103 | Ht 69.0 in | Wt 200.0 lb

## 2021-03-18 DIAGNOSIS — N401 Enlarged prostate with lower urinary tract symptoms: Secondary | ICD-10-CM | POA: Diagnosis not present

## 2021-03-18 DIAGNOSIS — N410 Acute prostatitis: Secondary | ICD-10-CM | POA: Diagnosis not present

## 2021-03-18 DIAGNOSIS — R3916 Straining to void: Secondary | ICD-10-CM

## 2021-03-18 LAB — BLADDER SCAN AMB NON-IMAGING: Scan Result: 121

## 2021-03-18 MED ORDER — SULFAMETHOXAZOLE-TRIMETHOPRIM 800-160 MG PO TABS: 1.0000 | ORAL_TABLET | Freq: Two times a day (BID) | ORAL | 0 refills | Status: DC

## 2021-03-18 MED ORDER — TAMSULOSIN HCL 0.4 MG PO CAPS: 0.8000 mg | ORAL_CAPSULE | Freq: Every day | ORAL | 0 refills | Status: DC

## 2021-03-18 NOTE — Progress Notes (Signed)
03/18/2021 1:53 PM   SAGAR TENGAN 1966/10/03 [id number]  Referring provider: [facility] [address] [place],  [place] [address]  Chief Complaint  Patient presents with   Benign Prostatic Hypertrophy    HPI: Cowen Pesqueira is a 55 y.o. male who presents for follow-up of a recent ED visit.  Presented to the ED 03/10/2021 complaining of fever, chills, urinary difficulty x48 hours Urinalysis nitrite positive with pyuria/microhematuria; WBC 16,000, elevated lactate Temp was 102.1 He met SIRS criteria for sepsis and was admitted Urine culture grew pansensitive E. Coli CT with prominent prostate and no hydronephrosis/stone; some fullness right collecting system He was treated with 3 days of Rocephin and discharged on 4 days of cephalexin which he completed yesterday Prior history of BPH on tamsulosin Feels his voiding symptoms are at baseline with decreased stream and straining to urinate IPSS 27/35    PMH: Past Medical History:  Diagnosis Date   Allergy    ED (erectile dysfunction)    Enlarged prostate    Hyperlipemia    Sleep apnea     Surgical History: Past Surgical History:  Procedure Laterality Date   COLONOSCOPY WITH PROPOFOL N/A 10/20/2017   Procedure: COLONOSCOPY WITH PROPOFOL;  Surgeon: Jonathon Bellows, MD;  Location: [facility] ENDOSCOPY;  Service: Gastroenterology;  Laterality: N/A;   VASECTOMY      Home Medications:  Allergies as of 03/18/2021       Reactions   Penicillins Itching, Swelling   Shellfish Allergy         Medication List        Accurate as of March 18, 2021  1:53 PM. If you have any questions, ask your nurse or doctor.          atorvastatin 20 MG tablet Commonly known as: LIPITOR Take 1 tablet (20 mg total) by mouth daily.   meloxicam 15 MG tablet Commonly known as: MOBIC Take 15 mg by mouth daily.   tamsulosin 0.4 MG Caps capsule Commonly known as: FLOMAX Take 1 capsule (0.4 mg total) by mouth daily.         Allergies:  Allergies  Allergen Reactions   Penicillins Itching and Swelling   Shellfish Allergy     Family History: Family History  Problem Relation Age of Onset   Other Father        enlarged prostate   Diabetes Father    Hypertension Mother     Social History:  reports that he has never smoked. He has never used smokeless tobacco. He reports current alcohol use of about 4.0 standard drinks of alcohol per week. He reports that he does not use drugs.   Physical Exam: BP 119/79   Pulse (!) 103   Ht '5\' 9"'  (1.753 m)   Wt 200 lb (90.7 kg)   BMI 29.53 kg/m   Constitutional:  Alert and oriented, No acute distress. HEENT:  AT, moist mucus membranes.  Trachea midline, no masses. Cardiovascular: No clubbing, cyanosis, or edema. Respiratory: Normal respiratory effort, no increased work of breathing. GU: Prostate boggy, 50 g, smooth without nodules.  Only lower one half able to be palpated digitally Skin: No rashes, bruises or suspicious lesions. Neurologic: Grossly intact, no focal deficits, moving all 4 extremities. Psychiatric: Normal mood and affect.   Pertinent Imaging: CT images were personally reviewed; prostate volume calculated at 51 cc.  Bladder wall thickening noted  Assessment & Plan:    1. Benign prostatic hyperplasia (BPH) with straining on urination Voiding  symptoms back to baseline which are severe Bladder scan PVR 121 mL Will increase tamsulosin 0.8 mg Follow-up 1 month for repeat IPSS/bladder scan Based on his age, severity of symptoms and elevated residual we discussed that he may want to consider outlet surgery Will discuss further on follow-up  2.  Acute prostatitis The admission UTI consistent with acute prostatitis He only received 7 days of antibiotics and we discussed the increased chance of developing chronic nonbacterial prostatitis secondary to inadequately treated acute prostatitis Rx Septra DS 1 twice daily x2 weeks sent to  [place], [place] [address], [place], [place] [address] [phone]

## 2021-03-19 LAB — URINALYSIS, COMPLETE
Bilirubin, UA: NEGATIVE
Glucose, UA: NEGATIVE
Ketones, UA: NEGATIVE
Leukocytes,UA: NEGATIVE
Nitrite, UA: NEGATIVE
Protein,UA: NEGATIVE
Specific Gravity, UA: 1.025 (ref 1.005–1.030)
Urobilinogen, Ur: 0.2 mg/dL (ref 0.2–1.0)
pH, UA: 5.5 (ref 5.0–7.5)

## 2021-03-19 LAB — MICROSCOPIC EXAMINATION
Bacteria, UA: NONE SEEN
Epithelial Cells (non renal): NONE SEEN /hpf (ref 0–10)

## 2021-03-23 LAB — CULTURE, URINE COMPREHENSIVE

## 2021-03-24 ENCOUNTER — Ambulatory Visit: Payer: No Typology Code available for payment source | Admitting: Urology

## 2021-03-26 ENCOUNTER — Encounter: Payer: Self-pay | Admitting: Physician Assistant

## 2021-04-08 ENCOUNTER — Other Ambulatory Visit: Payer: Self-pay

## 2021-04-08 DIAGNOSIS — W460XXD Contact with hypodermic needle, subsequent encounter: Secondary | ICD-10-CM

## 2021-04-08 NOTE — Progress Notes (Signed)
Pt presents today for  6 week Hep b check due to accidental needle stick. CL,RMA

## 2021-04-09 LAB — HEPATITIS B SURFACE ANTIBODY,QUALITATIVE: Hep B Surface Ab, Qual: REACTIVE

## 2021-04-14 ENCOUNTER — Other Ambulatory Visit: Payer: Self-pay | Admitting: Urology

## 2021-04-16 ENCOUNTER — Other Ambulatory Visit: Payer: Self-pay

## 2021-04-16 ENCOUNTER — Ambulatory Visit (INDEPENDENT_AMBULATORY_CARE_PROVIDER_SITE_OTHER): Payer: 59 | Admitting: Urology

## 2021-04-16 ENCOUNTER — Encounter: Payer: Self-pay | Admitting: Urology

## 2021-04-16 VITALS — BP 126/81 | HR 108 | Ht 69.0 in | Wt 202.0 lb

## 2021-04-16 DIAGNOSIS — R339 Retention of urine, unspecified: Secondary | ICD-10-CM

## 2021-04-16 DIAGNOSIS — R3916 Straining to void: Secondary | ICD-10-CM

## 2021-04-16 DIAGNOSIS — N401 Enlarged prostate with lower urinary tract symptoms: Secondary | ICD-10-CM | POA: Diagnosis not present

## 2021-04-16 LAB — BLADDER SCAN AMB NON-IMAGING: Scan Result: 118

## 2021-04-16 NOTE — Progress Notes (Signed)
04/16/2021 1:02 PM   TELVIN REINDERS 1966/03/29 [id number]  Referring provider: [facility] [address] [place],  [place] [address]  Chief Complaint  Patient presents with   Benign Prostatic Hypertrophy    HPI: 55 yo male presents for follow-up.  Refer to prior note 03/18/2021 for clinical summary Since his last visit he has noted marked improvement in his voiding symptoms and feel he is back at baseline Presently taking tamsulosin 0.8 mg daily IPSS today 6/35     PMH: Past Medical History:  Diagnosis Date   Allergy    ED (erectile dysfunction)    Enlarged prostate    Hyperlipemia    Sleep apnea     Surgical History: Past Surgical History:  Procedure Laterality Date   COLONOSCOPY WITH PROPOFOL N/A 10/20/2017   Procedure: COLONOSCOPY WITH PROPOFOL;  Surgeon: Jonathon Bellows, MD;  Location: [facility] ENDOSCOPY;  Service: Gastroenterology;  Laterality: N/A;   VASECTOMY      Home Medications:  Allergies as of 04/16/2021       Reactions   Penicillins Itching, Swelling   Shellfish Allergy         Medication List        Accurate as of April 16, 2021  1:02 PM. If you have any questions, ask your nurse or doctor.          atorvastatin 20 MG tablet Commonly known as: LIPITOR Take 1 tablet (20 mg total) by mouth daily.   meloxicam 15 MG tablet Commonly known as: MOBIC Take 15 mg by mouth daily.   sulfamethoxazole-trimethoprim 800-160 MG tablet Commonly known as: BACTRIM DS Take 1 tablet by mouth 2 (two) times daily.   tamsulosin 0.4 MG Caps capsule Commonly known as: FLOMAX Take 1 capsule (0.4 mg total) by mouth daily.   tamsulosin 0.4 MG Caps capsule Commonly known as: FLOMAX TAKE 2 CAPSULES(0.8 MG) BY MOUTH DAILY        Allergies:  Allergies  Allergen Reactions   Penicillins Itching and Swelling   Shellfish Allergy     Family History: Family History  Problem Relation Age of Onset   Other Father        enlarged  prostate   Diabetes Father    Hypertension Mother     Social History:  reports that he has never smoked. He has never used smokeless tobacco. He reports current alcohol use of about 4.0 standard drinks of alcohol per week. He reports that he does not use drugs.   Physical Exam: BP 126/81   Pulse (!) 108   Ht 5\' 9"  (1.753 m)   Wt 202 lb (91.6 kg)   BMI 29.83 kg/m   Constitutional:  Alert and oriented, No acute distress. HEENT: Winter AT, moist mucus membranes.  Trachea midline, no masses. Cardiovascular: No clubbing, cyanosis, or edema. Respiratory: Normal respiratory effort, no increased work of breathing. Skin: No rashes, bruises or suspicious lesions. Neurologic: Grossly intact, no focal deficits, moving all 4 extremities. Psychiatric: Normal mood and affect.    Assessment & Plan:    1. Benign prostatic hyperplasia (BPH) with LUTS Marked improvement in voiding pattern since last visit Bladder scan PVR still with moderate elevation at 118 though stable Continue tamsulosin; he may try decreasing to 0.4 mg however will increase if he has worsening voiding symptoms Follow-up 6 months for repeat symptom check and bladder scan To call earlier for worsening voiding symptoms  PSA on follow-up   Abbie Sons, MD  [facility] [address], [place], [place] [address] [phone]

## 2021-04-17 ENCOUNTER — Encounter: Payer: Self-pay | Admitting: Urology

## 2021-04-17 DIAGNOSIS — R339 Retention of urine, unspecified: Secondary | ICD-10-CM | POA: Insufficient documentation

## 2021-04-28 ENCOUNTER — Other Ambulatory Visit: Payer: Self-pay | Admitting: Urology

## 2021-05-28 ENCOUNTER — Other Ambulatory Visit: Payer: Self-pay

## 2021-08-30 ENCOUNTER — Other Ambulatory Visit: Payer: Self-pay

## 2021-10-13 ENCOUNTER — Other Ambulatory Visit: Payer: Self-pay | Admitting: Urology

## 2021-10-20 ENCOUNTER — Encounter: Payer: Self-pay | Admitting: Urology

## 2021-10-20 ENCOUNTER — Ambulatory Visit: Payer: Self-pay | Admitting: Urology

## 2022-01-24 ENCOUNTER — Telehealth: Payer: Self-pay | Admitting: Family Medicine

## 2022-01-24 ENCOUNTER — Other Ambulatory Visit: Payer: Self-pay | Admitting: Family Medicine

## 2022-01-24 MED ORDER — TAMSULOSIN HCL 0.4 MG PO CAPS: ORAL_CAPSULE | ORAL | 1 refills | Status: DC

## 2022-01-24 NOTE — Telephone Encounter (Signed)
Attempted to reach pt.  He was last seen on 11-11-20 with  [facility]. Patient needs an appt to est with another provider within the next 30 days.  Patient may have #30 day supply of atorvastatin when appt is made.   ?

## 2022-01-24 NOTE — Telephone Encounter (Signed)
[facility] faxed refill request for the following medications:  atorvastatin (LIPITOR) 20 MG tablet   Please advise.  

## 2022-01-25 NOTE — Telephone Encounter (Signed)
2nd attempt to reach pt. No VM.  ?

## 2022-02-02 ENCOUNTER — Telehealth: Payer: Self-pay | Admitting: Family Medicine

## 2022-02-02 NOTE — Telephone Encounter (Signed)
Denied. Patient needs an appointment and needs to established care with a provider.  ?

## 2022-02-02 NOTE — Telephone Encounter (Signed)
[facility] faxed refill request for the following medications:  atorvastatin (LIPITOR) 20 MG tablet   Please advise.  

## 2022-02-07 ENCOUNTER — Telehealth: Payer: Self-pay | Admitting: Family Medicine

## 2022-02-07 NOTE — Telephone Encounter (Signed)
[facility] faxed refill request for the following medications:  atorvastatin (LIPITOR) 20 MG tablet   Please advise.  

## 2022-03-07 NOTE — Progress Notes (Unsigned)
    I,Sha'taria Tyson,acting as a Education administrator for [employer], PA-C.,have documented all relevant documentation on the behalf of Mikey Kirschner, PA-C,as directed by  Mikey Kirschner, PA-C while in the presence of Mikey Kirschner, PA-C.   Acute Office Visit  Subjective:     Patient ID: Travis Mahoney, male    DOB: 1966-01-12, 56 y.o.   MRN: [id number]  No chief complaint on file.   HPI Patient is in today for a sore on his face. Patient reports it   ROS      Objective:    There were no vitals taken for this visit. {Vitals History (Optional):23777}  Physical Exam  No results found for any visits on 03/08/22.      Assessment & Plan:   Problem List Items Addressed This Visit   None   No orders of the defined types were placed in this encounter.   No follow-ups on file.  Beverlee Nims, Olivia Lopez de Gutierrez

## 2022-03-08 ENCOUNTER — Ambulatory Visit (INDEPENDENT_AMBULATORY_CARE_PROVIDER_SITE_OTHER): Payer: Self-pay | Admitting: Physician Assistant

## 2022-03-08 ENCOUNTER — Encounter: Payer: Self-pay | Admitting: Physician Assistant

## 2022-03-08 VITALS — BP 136/91 | HR 81 | Ht 69.0 in | Wt 214.1 lb

## 2022-03-08 DIAGNOSIS — L989 Disorder of the skin and subcutaneous tissue, unspecified: Secondary | ICD-10-CM

## 2022-03-08 NOTE — Assessment & Plan Note (Signed)
Advised pt need to r/o skin cancer w/ biopsy. Urgent referral dermatology.

## 2022-03-15 ENCOUNTER — Encounter: Payer: Self-pay | Admitting: Dermatology

## 2022-03-15 ENCOUNTER — Ambulatory Visit (INDEPENDENT_AMBULATORY_CARE_PROVIDER_SITE_OTHER): Payer: Self-pay | Admitting: Dermatology

## 2022-03-15 DIAGNOSIS — L818 Other specified disorders of pigmentation: Secondary | ICD-10-CM

## 2022-03-15 DIAGNOSIS — L0100 Impetigo, unspecified: Secondary | ICD-10-CM

## 2022-03-15 MED ORDER — MUPIROCIN 2 % EX OINT: 1.0000 "application " | TOPICAL_OINTMENT | Freq: Two times a day (BID) | CUTANEOUS | 2 refills | Status: DC

## 2022-03-15 MED ORDER — DOXYCYCLINE HYCLATE 100 MG PO TABS: ORAL_TABLET | ORAL | 2 refills | Status: DC

## 2022-03-15 NOTE — Progress Notes (Signed)
   New Patient Visit  Subjective  Travis Mahoney is a 56 y.o. male who presents for the following: Irregular skin lesion (First appeared about 3 months ago and has flared twice since it first appeared - started as a discoloration then crusting and blistering occurred. Lesion did itch and he would accidentally scratch it night).  The following portions of the chart were reviewed this encounter and updated as appropriate:   Tobacco  Allergies  Meds  Problems  Med Hx  Surg Hx  Fam Hx     Review of Systems:  No other skin or systemic complaints except as noted in HPI or Assessment and Plan.  Objective  Well appearing patient in no apparent distress; mood and affect are within normal limits.  A focused examination was performed including the face. Relevant physical exam findings are noted in the Assessment and Plan.  R upper lip 2.0 x 1.5 cm hyperpigmented macule.        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  (May have nasal or skin bacterial colonization causing recurrent Impetigo) If lesion recurs patient to RTC ASAP for culture.   Will discuss treatment options for hyperpigmentation at follow up appointment.   mupirocin ointment (BACTROBAN) 2 % - R upper lip Apply 1 application  topically 2 (two) times daily. Apply to aa BID x 2 weeks.  doxycycline (VIBRA-TABS) 100 MG tablet - R upper lip Take one tab po BID x 2 weeks. Take with food.  Return in about 2 months (around 05/15/2022).  Luther Redo, CMA, am acting as scribe for Sarina Ser, MD . Documentation: I have reviewed the above documentation for  accuracy and completeness, and I agree with the above.  Sarina Ser, MD

## 2022-03-15 NOTE — Patient Instructions (Signed)
Due to recent changes in healthcare laws, you may see results of your pathology and/or laboratory studies on MyChart before the doctors have had a chance to review them. We understand that in some cases there may be results that are confusing or concerning to you. Please understand that not all results are received at the same time and often the doctors may need to interpret multiple results in order to provide you with the best plan of care or course of treatment. Therefore, we ask that you please give us 2 business days to thoroughly review all your results before contacting the office for clarification. Should we see a critical lab result, you will be contacted sooner.   If You Need Anything After Your Visit  If you have any questions or concerns for your doctor, please call our main line at [phone] and press option 4 to reach your doctor's medical assistant. If no one answers, please leave a voicemail as directed and we will return your call as soon as possible. Messages left after 4 pm will be answered the following business day.   You may also send us a message via MyChart. We typically respond to MyChart messages within 1-2 business days.  For prescription refills, please ask your pharmacy to contact our office. Our fax number is [phone].  If you have an urgent issue when the clinic is closed that cannot wait until the next business day, you can page your doctor at the number below.    Please note that while we do our best to be available for urgent issues outside of office hours, we are not available 24/7.   If you have an urgent issue and are unable to reach us, you may choose to seek medical care at your doctor's office, retail clinic, urgent care center, or emergency room.  If you have a medical emergency, please immediately call 911 or go to the emergency department.  Pager Numbers  - Dr. Kowalski: [phone]  - Dr. Moye: [phone]  - Dr. Stewart:  [phone]  In the event of inclement weather, please call our main line at [phone] for an update on the status of any delays or closures.  Dermatology Medication Tips: Please keep the boxes that topical medications come in in order to help keep track of the instructions about where and how to use these. Pharmacies typically print the medication instructions only on the boxes and not directly on the medication tubes.   If your medication is too expensive, please contact our office at [phone] option 4 or send us a message through MyChart.   We are unable to tell what your co-pay for medications will be in advance as this is different depending on your insurance coverage. However, we may be able to find a substitute medication at lower cost or fill out paperwork to get insurance to cover a needed medication.   If a prior authorization is required to get your medication covered by your insurance company, please allow us 1-2 business days to complete this process.  Drug prices often vary depending on where the prescription is filled and some pharmacies may offer cheaper prices.  The website www.goodrx.com contains coupons for medications through different pharmacies. The prices here do not account for what the cost may be with help from insurance (it may be cheaper with your insurance), but the website can give you the price if you did not use any insurance.  - You can print the associated coupon and take it with   your prescription to the pharmacy.  - You may also stop by our office during regular business hours and pick up a GoodRx coupon card.  - If you need your prescription sent electronically to a different pharmacy, notify our office through [facility] MyChart or by phone at [phone] option 4.     Si Usted Necesita Algo Despus de Su Visita  Tambin puede enviarnos un mensaje a travs de MyChart. Por lo general respondemos a los mensajes de MyChart en el transcurso de 1 a 2  das hbiles.  Para renovar recetas, por favor pida a su farmacia que se ponga en contacto con nuestra oficina. Nuestro nmero de fax es el [phone].  Si tiene un asunto urgente cuando la clnica est cerrada y que no puede esperar hasta el siguiente da hbil, puede llamar/localizar a su doctor(a) al nmero que aparece a continuacin.   Por favor, tenga en cuenta que aunque hacemos todo lo posible para estar disponibles para asuntos urgentes fuera del horario de oficina, no estamos disponibles las 24 horas del da, los 7 das de la semana.   Si tiene un problema urgente y no puede comunicarse con nosotros, puede optar por buscar atencin mdica  en el consultorio de su doctor(a), en una clnica privada, en un centro de atencin urgente o en una sala de emergencias.  Si tiene una emergencia mdica, por favor llame inmediatamente al 911 o vaya a la sala de emergencias.  Nmeros de bper  - Dr. Kowalski: [phone]  - Dra. Moye: [phone]  - Dra. Stewart: [phone]  En caso de inclemencias del tiempo, por favor llame a nuestra lnea principal al [phone] para una actualizacin sobre el estado de cualquier retraso o cierre.  Consejos para la medicacin en dermatologa: Por favor, guarde las cajas en las que vienen los medicamentos de uso tpico para ayudarle a seguir las instrucciones sobre dnde y cmo usarlos. Las farmacias generalmente imprimen las instrucciones del medicamento slo en las cajas y no directamente en los tubos del medicamento.   Si su medicamento es muy caro, por favor, pngase en contacto con nuestra oficina llamando al [phone] y presione la opcin 4 o envenos un mensaje a travs de MyChart.   No podemos decirle cul ser su copago por los medicamentos por adelantado ya que esto es diferente dependiendo de la cobertura de su seguro. Sin embargo, es posible que podamos encontrar un medicamento sustituto a menor costo o llenar un formulario para que el  seguro cubra el medicamento que se considera necesario.   Si se requiere una autorizacin previa para que su compaa de seguros cubra su medicamento, por favor permtanos de 1 a 2 das hbiles para completar este proceso.  Los precios de los medicamentos varan con frecuencia dependiendo del lugar de dnde se surte la receta y alguna farmacias pueden ofrecer precios ms baratos.  El sitio web www.goodrx.com tiene cupones para medicamentos de diferentes farmacias. Los precios aqu no tienen en cuenta lo que podra costar con la ayuda del seguro (puede ser ms barato con su seguro), pero el sitio web puede darle el precio si no utiliz ningn seguro.  - Puede imprimir el cupn correspondiente y llevarlo con su receta a la farmacia.  - Tambin puede pasar por nuestra oficina durante el horario de atencin regular y recoger una tarjeta de cupones de GoodRx.  - Si necesita que su receta se enve electrnicamente a una farmacia diferente, informe a nuestra oficina a travs de MyChart de [facility]   o por telfono llamando al [phone] y presione la opcin 4.  

## 2022-03-22 ENCOUNTER — Ambulatory Visit: Payer: Self-pay

## 2022-03-22 NOTE — Telephone Encounter (Signed)
  Chief Complaint: Memory loss, Foggy memory, confusion Symptoms: ibid Frequency: over the past 1 year Pertinent Negatives: Patient denies  Disposition: '[]'$ ED /'[]'$ Urgent Care (no appt availability in office) / '[x]'$ Appointment(In office/virtual)/ '[]'$  [place] [facility]/ '[]'$ Home Care/ '[]'$ Refused Recommended Disposition /'[]'$  Mobile Bus/ '[]'$  Follow-up with PCP Additional Notes: Pt has noticed over the past year some neurological deficits. He is searching for words, forgetting things, feels mentally foggy, feels a bit confused. These seem to be increasing in frequency. He has forgotten to get off the highway at his exit. And was looking for the TV remote when it was in his hand. Reason for Disposition  New or worsening falling  Answer Assessment - Initial Assessment Questions 1. MAIN CONCERN OR SYMPTOM:  "What is your main concern right now?" "What questions do you have?" "What's the main symptom you're worried about?" (e.g., confusion, memory loss)     Memory loss, confusion, forgetfulness, missed exit,  2. ONSET:  "When did the symptom start (or worsen)?" (minutes, hours, days, weeks)     1 year ago - starting forgetting keys. Is losing some of his vocabulary. 3. BETTER-SAME-WORSE: "Are you (the patient) getting better, staying the same, or getting worse compared to the day you (they) were diagnosed or most recent hospital discharge ?"     worse 4. DIAGNOSIS: "Was the dementia diagnosed by a doctor?" If Yes, ask: "When?" (e.g., days, months, years ago)     no 5. MEDICATION: "Has there been any change in medicines recently?" (e.g., narcotics, antihistamines, benzodiazepines, etc.)     Takes flomax,  6. OTHER SYMPTOMS: "Are there any other symptoms?" (e.g., fever, cough, pain, falling)      7. SUPPORT: Document living circumstances and support (e.g., family, nursing home)  Answer Assessment - Initial Assessment Questions 1. LEVEL OF CONSCIOUSNESS: "How is he (she, the patient) acting  right now?" (e.g., alert-oriented, confused, lethargic, stuporous, comatose)     AO 2. ONSET: "When did the confusion start?"  (minutes, hours, days)     1 year 3. PATTERN "Does this come and go, or has it been constant since it started?"  "Is it present now?"     Increasing in severity 4. ALCOHOL or DRUGS: "Has he been drinking alcohol or taking any drugs?"      No - small amount of alcohol 5. NARCOTIC MEDICATIONS: "Has he been receiving any narcotic medications?" (e.g., morphine, Vicodin)     no 6. CAUSE: "What do you think is causing the confusion?"      Unsure 7. OTHER SYMPTOMS: "Are there any other symptoms?" (e.g., difficulty breathing, headache, fever, weakness)     no  Protocols used: Dementia Symptoms and Questions-A-AH, Confusion - Delirium-A-AH

## 2022-03-25 ENCOUNTER — Encounter: Payer: Self-pay | Admitting: Family Medicine

## 2022-03-25 ENCOUNTER — Ambulatory Visit (INDEPENDENT_AMBULATORY_CARE_PROVIDER_SITE_OTHER): Payer: Self-pay | Admitting: Family Medicine

## 2022-03-25 VITALS — BP 114/87 | HR 92 | Temp 99.3°F | Resp 14 | Wt 212.0 lb

## 2022-03-25 DIAGNOSIS — R413 Other amnesia: Secondary | ICD-10-CM

## 2022-03-25 DIAGNOSIS — G4700 Insomnia, unspecified: Secondary | ICD-10-CM

## 2022-03-25 DIAGNOSIS — G4733 Obstructive sleep apnea (adult) (pediatric): Secondary | ICD-10-CM

## 2022-03-25 DIAGNOSIS — R5383 Other fatigue: Secondary | ICD-10-CM

## 2022-03-25 DIAGNOSIS — E7800 Pure hypercholesterolemia, unspecified: Secondary | ICD-10-CM

## 2022-03-25 DIAGNOSIS — Z125 Encounter for screening for malignant neoplasm of prostate: Secondary | ICD-10-CM

## 2022-03-25 NOTE — Progress Notes (Signed)
I,Roshena L Chambers,acting as a scribe for Lelon Huh, MD.,have documented all relevant documentation on the behalf of Lelon Huh, MD,as directed by  Lelon Huh, MD while in the presence of Lelon Huh, MD.    Established patient visit   Patient: Travis Mahoney   DOB: [birth year]   56 y.o. Male  MRN: [id number] Visit Date: 03/25/2022  Today's healthcare provider: Lelon Huh, MD   Chief Complaint  Patient presents with   Memory Loss   Subjective    HPI  Memory problems: Patient complains of memory loss, foggy memory and confusion. He has noticed over the past year some neurological deficits. He is searching for words, forgetting things, feels mentally foggy, feels a bit confused. These seem to be increasing in frequency. He has forgotten to get off the highway at his exit. And was looking for the TV remote when it was in his hand. Patient reports this has been a problem for the past year, but has worsened over the past few weeks. 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 He also worked as Quarry manager in past but memory problems have only recently interfered with work.   He states that he does have trouble sleeping through the night. Is on tamsulosin prescribed by Dr. Bernardo Heater which helps with urinary frequency during the day, but still has to get up to void several times every night. He also has established history of sleep apnea and has constant pressure CPAP, but stopped using it last year because the pressure blowing on his face bothered him. He does have a home made dental appliance that he wears at night. His wife states he snores loudly and sometimes appears to stop breathing if the dental appliance falls out.   He also history of hyperlipidemia for which he is prescribed tamsulosin, however he has been of the medication now for several months.     03/25/2022    3:00 PM  MMSE - Mini Mental State Exam   Orientation to time 5  Orientation to Place 5  Registration 3  Attention/ Calculation 5  Recall 3  Language- name 2 objects 2  Language- repeat 1  Language- follow 3 step command 3  Language- read & follow direction 1  Write a sentence 1  Copy design 1  Total score 30     Medications: Outpatient Medications Prior to Visit  Medication Sig   doxycycline (VIBRA-TABS) 100 MG tablet Take one tab po BID x 2 weeks. Take with food.   meloxicam (MOBIC) 15 MG tablet Take 15 mg by mouth daily.   mupirocin ointment (BACTROBAN) 2 % Apply 1 application  topically 2 (two) times daily. Apply to aa BID x 2 weeks.   tamsulosin (FLOMAX) 0.4 MG CAPS capsule TAKE 2 CAPSULES(0.8 MG) BY MOUTH DAILY   atorvastatin (LIPITOR) 20 MG tablet Take 1 tablet (20 mg total) by mouth daily. (Patient not taking: Reported on 03/08/2022)   No facility-administered medications prior to visit.    Review of Systems  Constitutional:  Negative for appetite change, chills and fever.  Eyes:        Seeing more floaters in right eye  Respiratory:  Negative for chest tightness, shortness of breath and wheezing.   Cardiovascular:  Negative for chest pain and palpitations.  Gastrointestinal:  Negative for abdominal pain, nausea and vomiting.  Psychiatric/Behavioral:  Positive for confusion and decreased concentration.  Objective    BP 114/87 (BP Location: Left Arm, Patient Position: Sitting, Cuff Size: Large)   Pulse 92   Temp 99.3 F (37.4 C) (Oral)   Resp 14   Wt 212 lb (96.2 kg)   SpO2 99% Comment: room air  BMI 31.31 kg/m    Physical Exam    General: Appearance:    Overweight male in no acute distress  Eyes:    PERRL, conjunctiva/corneas clear, EOM's intact       Lungs:     Clear to auscultation bilaterally, respirations unlabored  Heart:    Normal heart rate. Normal rhythm. No murmurs, rubs, or gallops.    MS:   All extremities are intact.    Neurologic:   Awake, alert, oriented x 3. No apparent  focal neurological defect.         Assessment & Plan     1. Memory deficit Did very well on MM today. Is likely related to poor sleep due to untreated sleep apnea as below.  - CBC - Comprehensive metabolic panel - TSH - Vitamin B12 - VITAMIN D 25 Hydroxy (Vit-D Deficiency, Fractures) - Testosterone,Free and Total  2. Hypercholesterolemia Was tolerating atorvastatin well, but has been out for a few months  - Lipid panel   3. OSA (obstructive sleep apnea) Tried CPAP years ago which he did not tolerate. Is currently using a mouth piece which helps somewhat. Consider referral to sleep specialist if labs normal and this continues to be a problem.   4. Other fatigue Check labs, likely secondary to sleep apnea.   5. Insomnia, unspecified type Likely secondary to sleep apnea  6. Prostate cancer screening  - PSA Total (Reflex To Free)      The entirety of the information documented in the History of Present Illness, Review of Systems and Physical Exam were personally obtained by me. Portions of this information were initially documented by the CMA and reviewed by me for thoroughness and accuracy.     Lelon Huh, MD  [facility] [phone] (phone) [phone] (fax)  [place]

## 2022-03-28 ENCOUNTER — Telehealth: Payer: Self-pay

## 2022-03-28 ENCOUNTER — Other Ambulatory Visit: Payer: Self-pay | Admitting: *Deleted

## 2022-03-28 NOTE — Telephone Encounter (Signed)
Patient returned call. Reviewed message from CMA, Roshena that Dr. Sherrie Mustache if out this week. Patient reports he would like another provider to review results for recommendations. Requesting another refill on flomax 0.4 mg from PCP. Patient is close to running out of medication.

## 2022-03-29 ENCOUNTER — Other Ambulatory Visit: Payer: Self-pay | Admitting: Family Medicine

## 2022-03-29 DIAGNOSIS — E559 Vitamin D deficiency, unspecified: Secondary | ICD-10-CM

## 2022-03-29 DIAGNOSIS — E7800 Pure hypercholesterolemia, unspecified: Secondary | ICD-10-CM

## 2022-03-29 MED ORDER — ATORVASTATIN CALCIUM 20 MG PO TABS: 20.0000 mg | ORAL_TABLET | Freq: Every day | ORAL | 1 refills | Status: DC

## 2022-03-29 MED ORDER — VITAMIN D (ERGOCALCIFEROL) 1.25 MG (50000 UNIT) PO CAPS: 50000.0000 [IU] | ORAL_CAPSULE | ORAL | 0 refills | Status: DC

## 2022-03-29 NOTE — Progress Notes (Signed)
The 10-year ASCVD risk score (Arnett DK, et al., 2019) is: 5.7%   Values used to calculate the score:     Age: 56 years     Sex: Male     Is Non-Hispanic African American: Yes     Diabetic: No     Tobacco smoker: No     Systolic Blood Pressure: 114 mmHg     Is BP treated: No     HDL Cholesterol: 53 mg/dL     Total Cholesterol: 217 mg/dL

## 2022-03-31 ENCOUNTER — Other Ambulatory Visit: Payer: Self-pay

## 2022-03-31 LAB — COMPREHENSIVE METABOLIC PANEL
ALT: 29 IU/L (ref 0–44)
AST: 28 IU/L (ref 0–40)
Albumin/Globulin Ratio: 1.9 (ref 1.2–2.2)
Albumin: 4.8 g/dL (ref 3.8–4.9)
Alkaline Phosphatase: 83 IU/L (ref 44–121)
BUN/Creatinine Ratio: 12 (ref 9–20)
BUN: 14 mg/dL (ref 6–24)
Bilirubin Total: 0.7 mg/dL (ref 0.0–1.2)
CO2: 23 mmol/L (ref 20–29)
Calcium: 9.6 mg/dL (ref 8.7–10.2)
Chloride: 101 mmol/L (ref 96–106)
Creatinine, Ser: 1.16 mg/dL (ref 0.76–1.27)
Globulin, Total: 2.5 g/dL (ref 1.5–4.5)
Glucose: 97 mg/dL (ref 70–99)
Potassium: 4.6 mmol/L (ref 3.5–5.2)
Sodium: 138 mmol/L (ref 134–144)
Total Protein: 7.3 g/dL (ref 6.0–8.5)
eGFR: 74 mL/min/{1.73_m2} (ref >59)

## 2022-03-31 LAB — CBC
Hematocrit: 48.1 % (ref 37.5–51.0)
Hemoglobin: 16.2 g/dL (ref 13.0–17.7)
MCH: 31.1 pg (ref 26.6–33.0)
MCHC: 33.7 g/dL (ref 31.5–35.7)
MCV: 92 fL (ref 79–97)
Platelets: 226 10*3/uL (ref 150–450)
RBC: 5.21 x10E6/uL (ref 4.14–5.80)
RDW: 11.6 % (ref 11.6–15.4)
WBC: 6.9 10*3/uL (ref 3.4–10.8)

## 2022-03-31 LAB — LIPID PANEL
Chol/HDL Ratio: 4.1 ratio (ref 0.0–5.0)
Cholesterol, Total: 217 mg/dL — ABNORMAL HIGH (ref 100–199)
HDL: 53 mg/dL (ref >39)
LDL Chol Calc (NIH): 152 mg/dL — ABNORMAL HIGH (ref 0–99)
Triglycerides: 69 mg/dL (ref 0–149)
VLDL Cholesterol Cal: 12 mg/dL (ref 5–40)

## 2022-03-31 LAB — TSH: TSH: 1.88 u[IU]/mL (ref 0.450–4.500)

## 2022-03-31 LAB — PSA TOTAL (REFLEX TO FREE): Prostate Specific Ag, Serum: 1.4 ng/mL (ref 0.0–4.0)

## 2022-03-31 LAB — VITAMIN D 25 HYDROXY (VIT D DEFICIENCY, FRACTURES): Vit D, 25-Hydroxy: 19.5 ng/mL — ABNORMAL LOW (ref 30.0–100.0)

## 2022-03-31 LAB — VITAMIN B12: Vitamin B-12: 670 pg/mL (ref 232–1245)

## 2022-03-31 LAB — TESTOSTERONE,FREE AND TOTAL
Testosterone, Free: 8.2 pg/mL (ref 7.2–24.0)
Testosterone: 722 ng/dL (ref 264–916)

## 2022-05-05 IMAGING — CT CT HEAD W/O CM
3 series · 15 of 47 positions shown, 18 images · non-contrast
Comparison: August 05, 2012

CLINICAL DATA: Headache and dizziness

EXAM:
CT HEAD WITHOUT CONTRAST
TECHNIQUE: Contiguous axial images were obtained from the base of the skull
through the vertex without intravenous contrast.

[Series 2: head wo · axial · 0.43mm/px · z∈[+300,+430]mm · 9 of 32 slices shown, 12 images]
[im 3/32  brain]
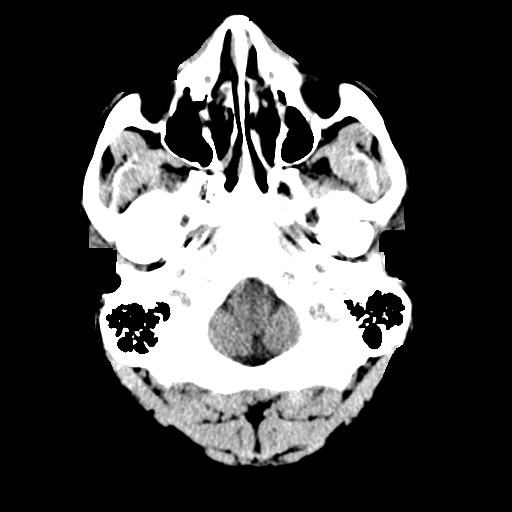
[im 3/32  bone]
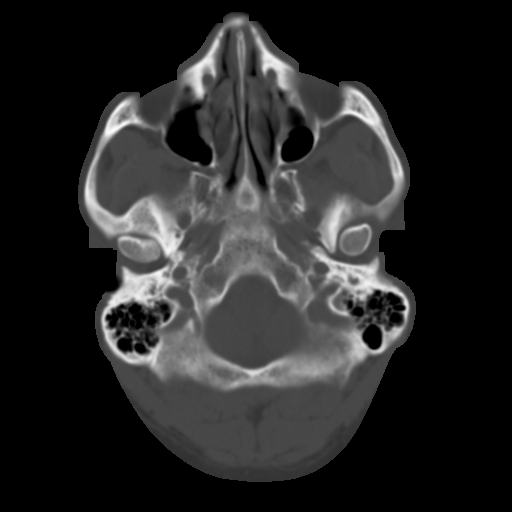
[im 6/32  brain]
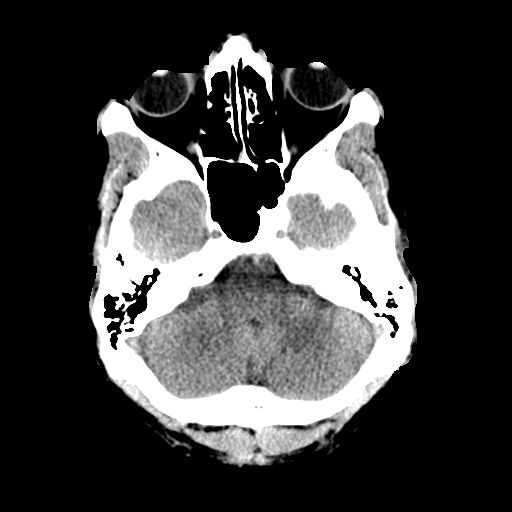
[im 9/32  brain]
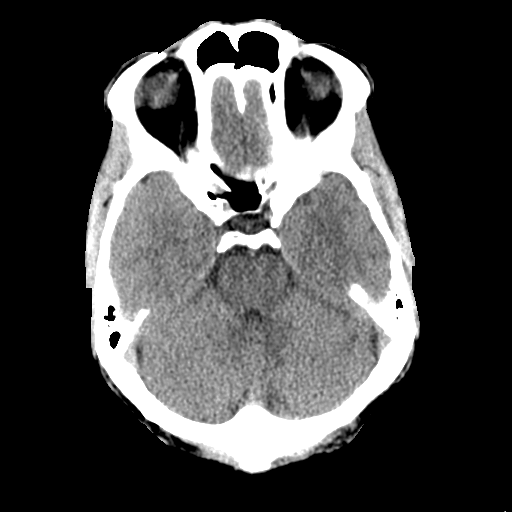
[im 12/32  brain]
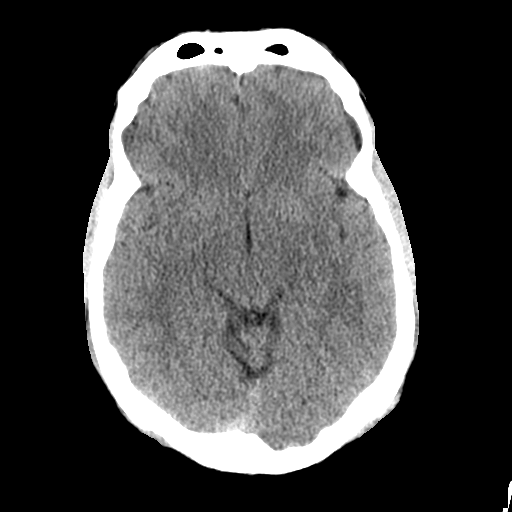
[im 17/32  brain]
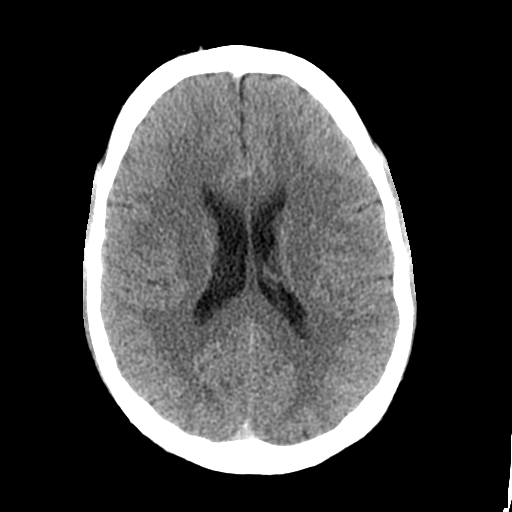
[im 17/32  bone]
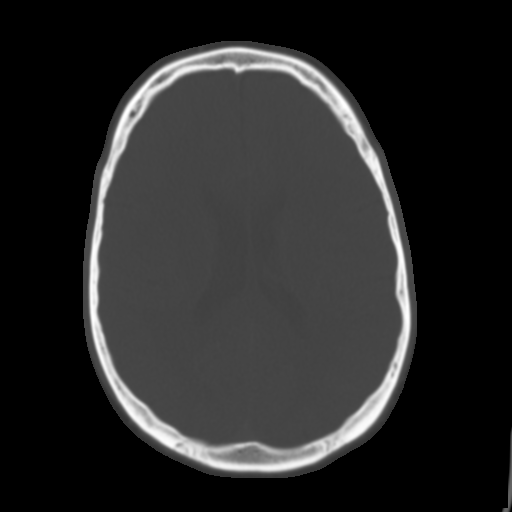
[im 20/32  brain]
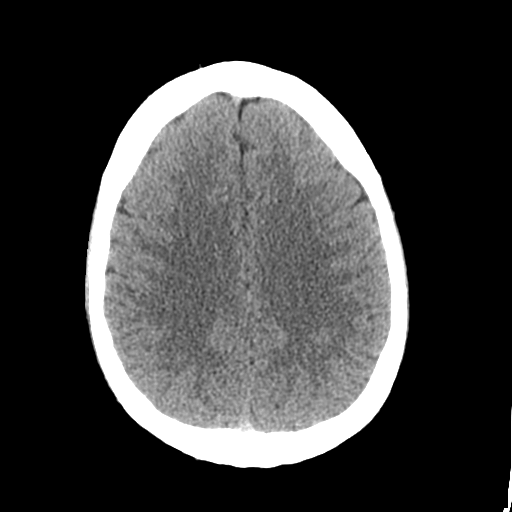
[im 23/32  brain]
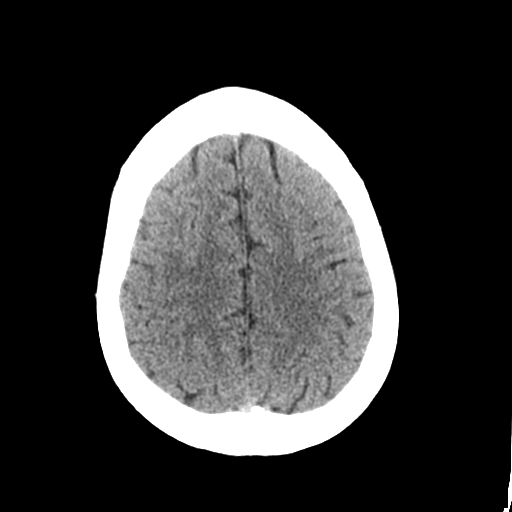
[im 26/32  brain]
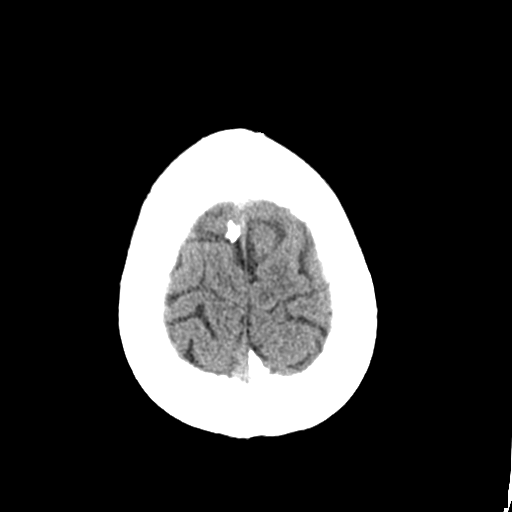
[im 29/32  brain]
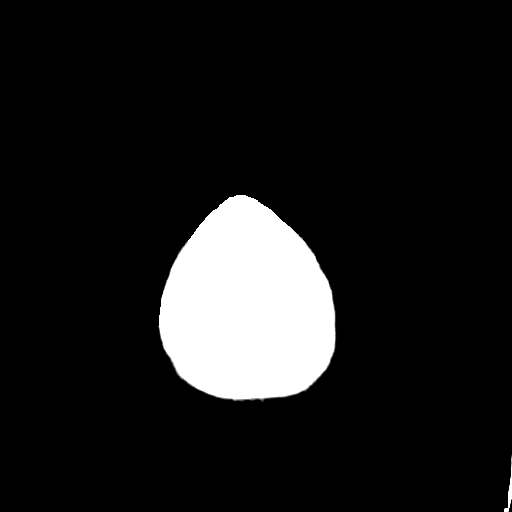
[im 29/32  bone]
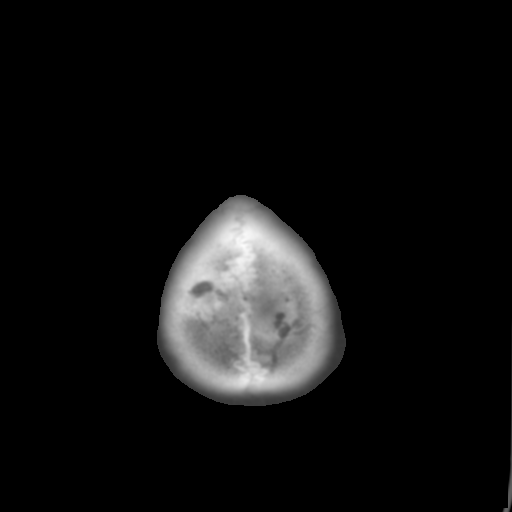

[Series 4: coronal soft tissue · coronal · 0.31mm/px · 3 of 70 slices shown]
[im 24/70  brain]
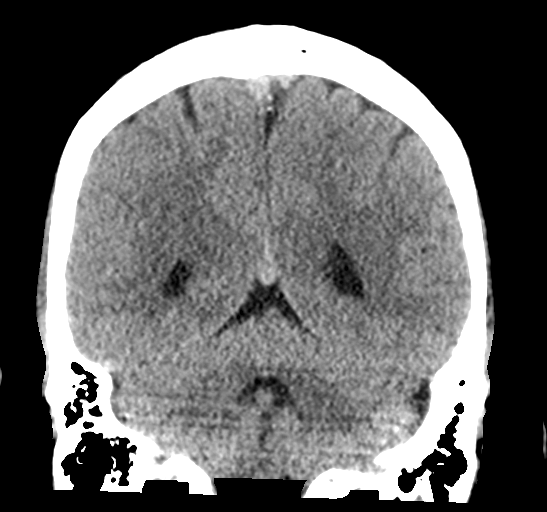
[im 31/70  brain]
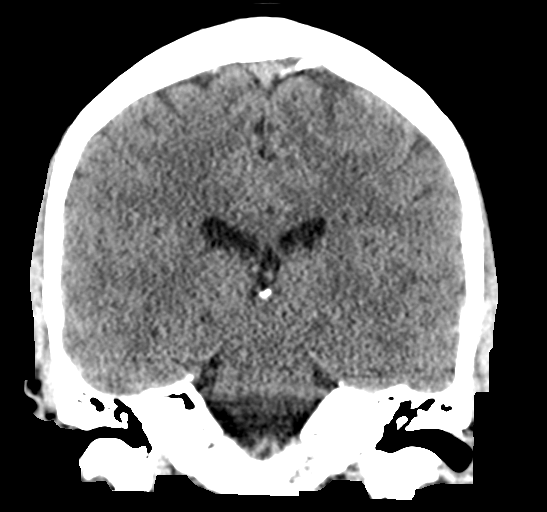
[im 39/70  brain]
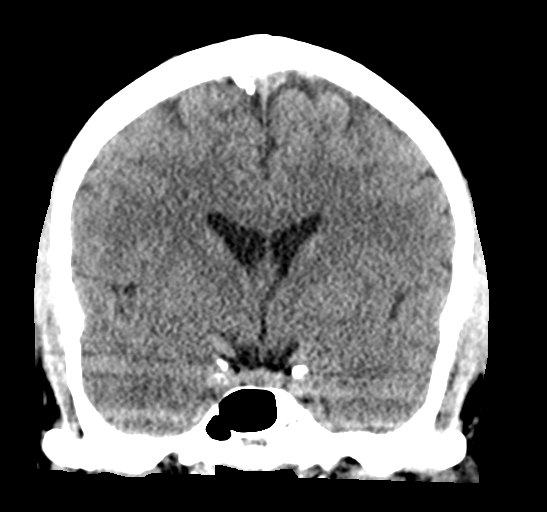

[Series 5: sagittal soft tissue · sagittal · 0.31mm/px · 3 of 55 slices shown]
[im 19/55  brain]
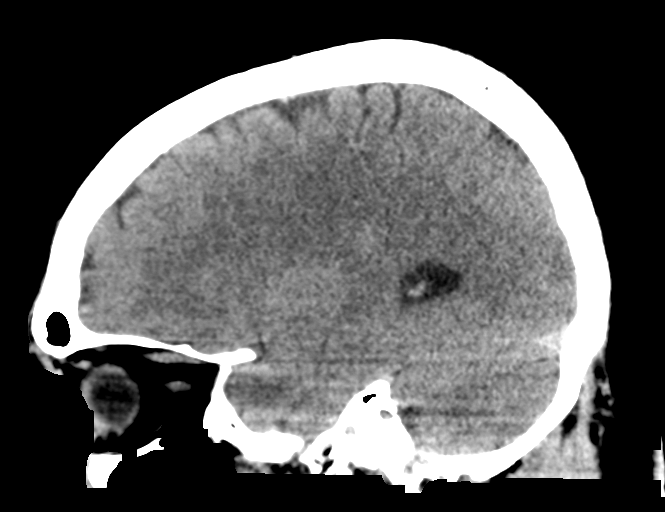
[im 28/55  brain]
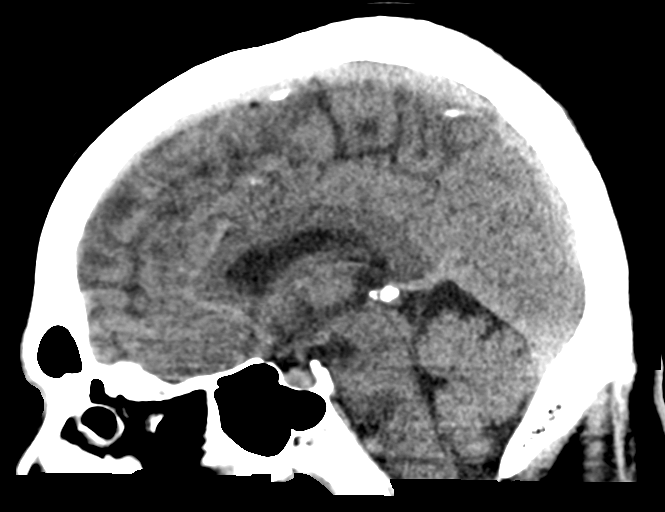
[im 37/55  brain]
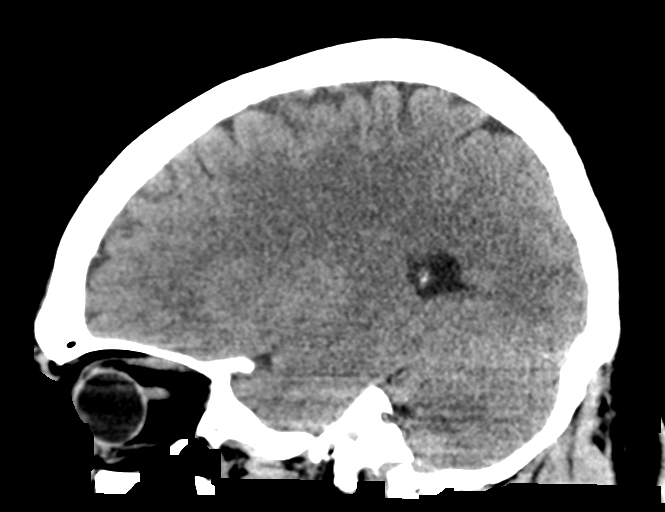

[15 of 47 positions shown; findings below may reference images not displayed]

FINDINGS: Brain: Ventricles and sulci are normal in size and configuration.
There is no intracranial mass, hemorrhage, extra-axial fluid
collection, or midline shift. The brain parenchyma appears
unremarkable. No evident acute infarct.

Vascular: No hyperdense vessel.  No evident vascular calcification.

Skull: The bony calvarium appears intact.

Sinuses/Orbits: There is mucosal thickening in multiple ethmoid air
cells. There is slight mucosal thickening in the anterior sphenoid
sinus regions. Orbits appear symmetric bilaterally.

Other: Visualized mastoid air cells are clear.
IMPRESSION: Normal appearing brain parenchyma. No mass or hemorrhage. There are
foci of paranasal sinus disease.

## 2022-05-08 ENCOUNTER — Telehealth: Payer: Self-pay | Admitting: Family Medicine

## 2022-05-08 ENCOUNTER — Encounter: Payer: Self-pay | Admitting: Family Medicine

## 2022-05-08 NOTE — Telephone Encounter (Signed)
Not sure why, but just now seeing labs drawn in June. Labs were mostly normal. Cholesterol slightly high, vitamin d borderline low. Need to continue vitamin D supplement.  Nothing in labs to explain fatigue. It's probably due to sleep apnea.  Found records regarding his CPAP machine and looks like it was automatically titrating CPAP. He can either try that again, or we can refer to sleep specialist for further evaluation.

## 2022-05-16 ENCOUNTER — Ambulatory Visit: Payer: PRIVATE HEALTH INSURANCE | Admitting: Dermatology

## 2022-06-30 ENCOUNTER — Ambulatory Visit: Payer: Self-pay | Admitting: *Deleted

## 2022-06-30 ENCOUNTER — Telehealth: Payer: Self-pay | Admitting: *Deleted

## 2022-06-30 ENCOUNTER — Other Ambulatory Visit: Payer: Self-pay | Admitting: Physician Assistant

## 2022-06-30 ENCOUNTER — Ambulatory Visit: Payer: Self-pay | Admitting: Dermatology

## 2022-06-30 DIAGNOSIS — L989 Disorder of the skin and subcutaneous tissue, unspecified: Secondary | ICD-10-CM

## 2022-06-30 NOTE — Telephone Encounter (Signed)
Copied from [place] [phone]. Topic: Referral - Request for Referral >> Jun 30, 2022  1:27 PM Sabas Sous wrote: Has patient seen PCP for this complaint? Yes.   *If NO, is insurance requiring patient see PCP for this issue before PCP can refer them? Referral for which specialty: Dermatology  Preferred provider/office: [facility] recommended in [place]  Reason for referral: Flare up on face, previously seen in [place] with no success. Wants to be seen in [place] insurance: [employer]

## 2022-06-30 NOTE — Telephone Encounter (Signed)
LVMTCB. CRM created. Ok for [facility] to advise per [place].  Looks as patient also has derm appt this afternoon at 4 pm

## 2022-06-30 NOTE — Telephone Encounter (Signed)
  Chief Complaint: raised red rash to face and spreading  Symptoms: red raised rash to right lip x couple of weeks ago and now spreading to left side of nose to eye. Mild itching, burns, stings painful no fever Frequency: worsen this am  Pertinent Negatives: Patient denies chest pain no difficulty breahting no fever no drainage noted Disposition: '[]'$ ED /'[x]'$ Urgent Care (no appt availability in office) / '[]'$ Appointment(In office/virtual)/ '[]'$  [place] [facility]/ '[]'$ Home Care/ '[]'$ Refused Recommended Disposition /'[]'$ [place] Mobile [facility]/ '[]'$  Follow-up with PCP Additional Notes:   No available appt until oct 2. Recommended UC.     Reason for Disposition  [1] Localized rash is very painful AND [2] no fever  Answer Assessment - Initial Assessment Questions 1. APPEARANCE of RASH: "Describe the rash."      Red raised rash to face right lip and spreading to left side of nose to eye  2. LOCATION: "Where is the rash located?"      Face , nose left side and  spreading to eye, right lip  3. NUMBER: "How many spots are there?"      na 4. SIZE: "How big are the spots?" (Inches, centimeters or compare to size of a coin)      na 5. ONSET: "When did the rash start?"      Couple of weeks and now worse  6. ITCHING: "Does the rash itch?" If Yes, ask: "How bad is the itch?"  (Scale 0-10; or none, mild, moderate, severe)     Mild itching  7. PAIN: "Does the rash hurt?" If Yes, ask: "How bad is the pain?"  (Scale 0-10; or none, mild, moderate, severe)    - NONE (0): no pain    - MILD (1-3): doesn't interfere with normal activities     - MODERATE (4-7): interferes with normal activities or awakens from sleep     - SEVERE (8-10): excruciating pain, unable to do any normal activities     Yes burns stings  8. OTHER SYMPTOMS: "Do you have any other symptoms?" (e.g., fever)     No fever 9. PREGNANCY: "Is there any chance you are pregnant?" "When was your last menstrual period?"     na  Protocols used: Rash or  Redness - Localized-A-AH

## 2022-07-04 ENCOUNTER — Ambulatory Visit (INDEPENDENT_AMBULATORY_CARE_PROVIDER_SITE_OTHER): Payer: 59 | Admitting: Dermatology

## 2022-07-04 ENCOUNTER — Encounter: Payer: Self-pay | Admitting: Dermatology

## 2022-07-04 DIAGNOSIS — R21 Rash and other nonspecific skin eruption: Secondary | ICD-10-CM

## 2022-07-04 DIAGNOSIS — L0100 Impetigo, unspecified: Secondary | ICD-10-CM | POA: Diagnosis not present

## 2022-07-04 MED ORDER — DOXYCYCLINE HYCLATE 100 MG PO TABS: 100.0000 mg | ORAL_TABLET | Freq: Two times a day (BID) | ORAL | 0 refills | Status: AC

## 2022-07-04 NOTE — Progress Notes (Signed)
   Follow-Up Visit   Subjective  Travis Mahoney is a 56 y.o. male who presents for the following: Follow-up (Recheck Impetigo vs HSV - rash came back and seems to have spread to left nose treated with doxycycline and mupirocin. Has a burning sensation sometimes/). He states that the discoloration of his right nasolabial area never resolved.  He said the soreness recently recurred on the right nasolabial and newly on the left nose Occasional doxycycline and mupirocin did not seem to help much.  The following portions of the chart were reviewed this encounter and updated as appropriate:   Tobacco  Allergies  Meds  Problems  Med Hx  Surg Hx  Fam Hx     Review of Systems:  No other skin or systemic complaints except as noted in HPI or Assessment and Plan.  Objective  Well appearing patient in no apparent distress; mood and affect are within normal limits.  A focused examination was performed including face. Relevant physical exam findings are noted in the Assessment and Plan.  Right nasolabial Rough hyperpigmented patch         Assessment & Plan  Rash Right nasolabial Impetigo vs HSV vs Atopy vs seborrheic dermatitis versus other  Discussed difficulty determining exact diagnosis due to clinical presentation.  We will need cultures and possibly other labs to help define this better.    Restart Doxycycline 100 mg 1 po bid x 7 days,  Mupirocin oint bid  May consider HSV antibody labs pending culture results.  doxycycline (VIBRA-TABS) 100 MG tablet - Right nasolabial Take 1 tablet (100 mg total) by mouth 2 (two) times daily. With food and plenty of fluid  Aerobic Culture - Right nasolabial Culture, Virus - Right nasolabial  Impetigo  Related Medications mupirocin ointment (BACTROBAN) 2 % Apply 1 application  topically 2 (two) times daily. Apply to aa BID x 2 weeks.  doxycycline (VIBRA-TABS) 100 MG tablet Take one tab po BID x 2 weeks. Take with food.  Return if  symptoms worsen or fail to improve.  I, Ashok Cordia, CMA, am acting as scribe for Sarina Ser, MD . Documentation: I have reviewed the above documentation for accuracy and completeness, and I agree with the above.  Sarina Ser, MD

## 2022-07-04 NOTE — Patient Instructions (Addendum)
Doxycycline should be taken with food to prevent nausea. Do not lay down for 30 minutes after taking. Be cautious with sun exposure and use good sun protection while on this medication. Pregnant women should not take this medication.   Due to recent changes in healthcare laws, you may see results of your pathology and/or laboratory studies on MyChart before the doctors have had a chance to review them. We understand that in some cases there may be results that are confusing or concerning to you. Please understand that not all results are received at the same time and often the doctors may need to interpret multiple results in order to provide you with the best plan of care or course of treatment. Therefore, we ask that you please give us 2 business days to thoroughly review all your results before contacting the office for clarification. Should we see a critical lab result, you will be contacted sooner.   If You Need Anything After Your Visit  If you have any questions or concerns for your doctor, please call our main line at [phone] and press option 4 to reach your doctor's medical assistant. If no one answers, please leave a voicemail as directed and we will return your call as soon as possible. Messages left after 4 pm will be answered the following business day.   You may also send us a message via MyChart. We typically respond to MyChart messages within 1-2 business days.  For prescription refills, please ask your pharmacy to contact our office. Our fax number is [phone].  If you have an urgent issue when the clinic is closed that cannot wait until the next business day, you can page your doctor at the number below.    Please note that while we do our best to be available for urgent issues outside of office hours, we are not available 24/7.   If you have an urgent issue and are unable to reach us, you may choose to seek medical care at your doctor's office, retail clinic, urgent care  center, or emergency room.  If you have a medical emergency, please immediately call 911 or go to the emergency department.  Pager Numbers  - Dr. Kowalski: [phone]  - Dr. Moye: [phone]  - Dr. Stewart: [phone]  In the event of inclement weather, please call our main line at [phone] for an update on the status of any delays or closures.  Dermatology Medication Tips: Please keep the boxes that topical medications come in in order to help keep track of the instructions about where and how to use these. Pharmacies typically print the medication instructions only on the boxes and not directly on the medication tubes.   If your medication is too expensive, please contact our office at [phone] option 4 or send us a message through MyChart.   We are unable to tell what your co-pay for medications will be in advance as this is different depending on your insurance coverage. However, we may be able to find a substitute medication at lower cost or fill out paperwork to get insurance to cover a needed medication.   If a prior authorization is required to get your medication covered by your insurance company, please allow us 1-2 business days to complete this process.  Drug prices often vary depending on where the prescription is filled and some pharmacies may offer cheaper prices.  The website www.goodrx.com contains coupons for medications through different pharmacies. The prices here do not account for what the   cost may be with help from insurance (it may be cheaper with your insurance), but the website can give you the price if you did not use any insurance.  - You can print the associated coupon and take it with your prescription to the pharmacy.  - You may also stop by our office during regular business hours and pick up a GoodRx coupon card.  - If you need your prescription sent electronically to a different pharmacy, notify our office through [place] MyChart or by  phone at [phone] option 4.     Si Usted Necesita Algo Despus de Su Visita  Tambin puede enviarnos un mensaje a travs de MyChart. Por lo general respondemos a los mensajes de MyChart en el transcurso de 1 a 2 das hbiles.  Para renovar recetas, por favor pida a su farmacia que se ponga en contacto con nuestra oficina. Nuestro nmero de fax es el [phone].  Si tiene un asunto urgente cuando la clnica est cerrada y que no puede esperar hasta el siguiente da hbil, puede llamar/localizar a su doctor(a) al nmero que aparece a continuacin.   Por favor, tenga en cuenta que aunque hacemos todo lo posible para estar disponibles para asuntos urgentes fuera del horario de oficina, no estamos disponibles las 24 horas del da, los 7 das de la semana.   Si tiene un problema urgente y no puede comunicarse con nosotros, puede optar por buscar atencin mdica  en el consultorio de su doctor(a), en una clnica privada, en un centro de atencin urgente o en una sala de emergencias.  Si tiene una emergencia mdica, por favor llame inmediatamente al 911 o vaya a la sala de emergencias.  Nmeros de bper  - Dr. Kowalski: [phone]  - Dra. Moye: [phone]  - Dra. Stewart: [phone]  En caso de inclemencias del tiempo, por favor llame a nuestra lnea principal al [phone] para una actualizacin sobre el estado de cualquier retraso o cierre.  Consejos para la medicacin en dermatologa: Por favor, guarde las cajas en las que vienen los medicamentos de uso tpico para ayudarle a seguir las instrucciones sobre dnde y cmo usarlos. Las farmacias generalmente imprimen las instrucciones del medicamento slo en las cajas y no directamente en los tubos del medicamento.   Si su medicamento es muy caro, por favor, pngase en contacto con nuestra oficina llamando al [phone] y presione la opcin 4 o envenos un mensaje a travs de MyChart.   No podemos decirle cul ser su copago  por los medicamentos por adelantado ya que esto es diferente dependiendo de la cobertura de su seguro. Sin embargo, es posible que podamos encontrar un medicamento sustituto a menor costo o llenar un formulario para que el seguro cubra el medicamento que se considera necesario.   Si se requiere una autorizacin previa para que su compaa de seguros cubra su medicamento, por favor permtanos de 1 a 2 das hbiles para completar este proceso.  Los precios de los medicamentos varan con frecuencia dependiendo del lugar de dnde se surte la receta y alguna farmacias pueden ofrecer precios ms baratos.  El sitio web www.goodrx.com tiene cupones para medicamentos de diferentes farmacias. Los precios aqu no tienen en cuenta lo que podra costar con la ayuda del seguro (puede ser ms barato con su seguro), pero el sitio web puede darle el precio si no utiliz ningn seguro.  - Puede imprimir el cupn correspondiente y llevarlo con su receta a la farmacia.  - Tambin puede pasar por   puede pasar por nuestra oficina durante el horario de atencin regular y Charity fundraiser una tarjeta de cupones de GoodRx.  - Si necesita que su receta se enve electrnicamente a una farmacia diferente, informe a nuestra oficina a travs de MyChart de [place] o por telfono llamando al [phone] y presione la opcin 4.

## 2022-07-06 LAB — AEROBIC CULTURE

## 2022-07-07 ENCOUNTER — Telehealth: Payer: Self-pay

## 2022-07-07 NOTE — Telephone Encounter (Signed)
Patient informed of culture results.  

## 2022-07-07 NOTE — Telephone Encounter (Signed)
-----   Message from Ralene Bathe, MD sent at 07/06/2022  5:01 PM EDT ----- Culture from skin of face 07/04/2022 showed No abnormal bacterial growth Viral culture is still pending.

## 2022-07-12 ENCOUNTER — Telehealth: Payer: Self-pay

## 2022-07-12 DIAGNOSIS — R21 Rash and other nonspecific skin eruption: Secondary | ICD-10-CM

## 2022-07-12 LAB — VIRUS CULTURE

## 2022-07-12 MED ORDER — MOMETASONE FUROATE 0.1 % EX CREA: TOPICAL_CREAM | CUTANEOUS | 0 refills | Status: DC

## 2022-07-12 NOTE — Telephone Encounter (Signed)
Patient informed of culture results. Requisition form printed and left at front desk check-in. Prescription sent to pharmacy.

## 2022-07-12 NOTE — Telephone Encounter (Signed)
-----   Message from Ralene Bathe, MD sent at 07/12/2022 12:12 PM EDT ----- Viral culture from 07/04/2022 was negative.  Start Mometasone cream thin coat qd-bid to aa face 5 days per week (M-F) - send to pharmacy. Order HSV I&II antibody blood test. Schedule follow up appt for 6 weeks for re-check

## 2022-07-13 ENCOUNTER — Other Ambulatory Visit: Payer: Self-pay | Admitting: Dermatology

## 2022-07-14 LAB — HSV 1 AND 2 AB, IGG
HSV 1 Glycoprotein G Ab, IgG: <0.91 index (ref 0.00–0.90)
HSV 2 IgG, Type Spec: <0.91 index (ref 0.00–0.90)

## 2022-07-18 ENCOUNTER — Telehealth: Payer: Self-pay

## 2022-07-18 NOTE — Telephone Encounter (Signed)
-----   Message from Ralene Bathe, MD sent at 07/18/2022 12:33 PM EDT ----- HSV 1&2 antibodies both negative. This suggests no previous infection with Herpes Simplex virus.  Therefore, the recurrent rashes appear to NOT be associated with "fever blister" outbreaks

## 2022-07-18 NOTE — Telephone Encounter (Signed)
Patient informed of results.   Patient wanted to let you know that he recently stopped using his face mask for his CPAP machine and made his own mouth piece (like football players use, is how he described it). Patient is questioning could he possibly getting bacteria from this mouth piece and causing it to spread.

## 2022-07-19 NOTE — Telephone Encounter (Signed)
Patient advised of information per Dr. Kowalski. aw 

## 2022-09-14 ENCOUNTER — Ambulatory Visit (INDEPENDENT_AMBULATORY_CARE_PROVIDER_SITE_OTHER): Payer: 59 | Admitting: Dermatology

## 2022-09-14 DIAGNOSIS — L0100 Impetigo, unspecified: Secondary | ICD-10-CM

## 2022-09-14 DIAGNOSIS — L2089 Other atopic dermatitis: Secondary | ICD-10-CM

## 2022-09-14 DIAGNOSIS — R21 Rash and other nonspecific skin eruption: Secondary | ICD-10-CM

## 2022-09-14 DIAGNOSIS — L819 Disorder of pigmentation, unspecified: Secondary | ICD-10-CM

## 2022-09-14 DIAGNOSIS — Z79899 Other long term (current) drug therapy: Secondary | ICD-10-CM | POA: Diagnosis not present

## 2022-09-14 MED ORDER — OPZELURA 1.5 % EX CREA: TOPICAL_CREAM | CUTANEOUS | 2 refills | Status: DC

## 2022-09-14 NOTE — Patient Instructions (Addendum)
[place], [address] [place] [address] Phone: [phone]  Fax: [phone]   Start Mupirocin ointment apply inside each nostril at bedtime for 2 weeks      Due to recent changes in healthcare laws, you may see results of your pathology and/or laboratory studies on MyChart before the doctors have had a chance to review them. We understand that in some cases there may be results that are confusing or concerning to you. Please understand that not all results are received at the same time and often the doctors may need to interpret multiple results in order to provide you with the best plan of care or course of treatment. Therefore, we ask that you please give [place] 2 business days to thoroughly review all your results before contacting the office for clarification. Should we see a critical lab result, you will be contacted sooner.   If You Need Anything After Your Visit  If you have any questions or concerns for your doctor, please call our main line at [phone] and press option 4 to reach your doctor's medical assistant. If no one answers, please leave a voicemail as directed and we will return your call as soon as possible. Messages left after 4 pm will be answered the following business day.   You may also send [place] a message via [place]. We typically respond to MyChart messages within 1-2 business days.  For prescription refills, please ask your pharmacy to contact our office. Our fax number is [phone].  If you have an urgent issue when the clinic is closed that cannot wait until the next business day, you can page your doctor at the number below.    Please note that while we do our best to be available for urgent issues outside of office hours, we are not available 24/7.   If you have an urgent issue and are unable to reach [place], you may choose to seek medical care at your doctor's office, retail clinic, urgent care  center, or emergency room.  If you have a medical emergency, please immediately call 911 or go to the emergency department.  Pager Numbers  - Dr. Nehemiah Massed: [phone]  - Dr. Laurence Ferrari: [phone]  - Dr. Nicole Kindred: [phone]  In the event of inclement weather, please call our main line at [phone] for an update on the status of any delays or closures.  Dermatology Medication Tips: Please keep the boxes that topical medications come in in order to help keep track of the instructions about where and how to use these. Pharmacies typically print the medication instructions only on the boxes and not directly on the medication tubes.   If your medication is too expensive, please contact our office at [phone] option 4 or send [place] a message through [place].   We are unable to tell what your co-pay for medications will be in advance as this is different depending on your insurance coverage. However, we may be able to find a substitute medication at lower cost or fill out paperwork to get insurance to cover a needed medication.   If a prior authorization is required to get your medication covered by your insurance company, please allow [place] 1-2 business days to complete this process.  Drug prices often vary depending on where the prescription is filled and some pharmacies may offer cheaper prices.  The website www.goodrx.com contains coupons for medications through different pharmacies. The prices here do not  account for what the cost may be with help from insurance (it may be cheaper with your insurance), but the website can give you the price if you did not use any insurance.  - You can print the associated coupon and take it with your prescription to the pharmacy.  - You may also stop by our office during regular business hours and pick up a GoodRx coupon card.  - If you need your prescription sent electronically to a different pharmacy, notify our office through [facility] or by  phone at [phone] option 4.     Si Usted Necesita Algo Despus de Su Visita  Tambin puede enviarnos un mensaje a travs de Pharmacist, community. Por lo general respondemos a los mensajes de MyChart en el transcurso de 1 a 2 das hbiles.  Para renovar recetas, por favor pida a su farmacia que se ponga en contacto con nuestra oficina. Harland Dingwall de fax es [place] [phone].  Si tiene un asunto urgente cuando la clnica est cerrada y que no puede esperar hasta el siguiente da hbil, puede llamar/localizar a su doctor(a) al nmero que aparece a continuacin.   Por favor, tenga en cuenta que aunque hacemos todo lo posible para estar disponibles para asuntos urgentes fuera del horario de [place], no estamos disponibles las 24 horas del da, los 7 das de la [place].   Si tiene un problema urgente y no puede comunicarse con nosotros, puede optar por buscar atencin mdica  en el consultorio de su doctor(a), en una clnica privada, en un centro de atencin urgente o en una sala de emergencias.  Si tiene Engineering geologist, por favor llame inmediatamente al 911 o vaya a la sala de emergencias.  Nmeros de bper  - Dr. Nehemiah Massed: [phone]  - Dra. Moye: [phone]  - Dra. Nicole Kindred: [phone]  En caso de inclemencias del [place], por favor llame a [place]ie Kindred principal al [phone] para una actualizacin sobre el [place] de cualquier retraso o cierre.  Consejos para la medicacin en dermatologa: Por favor, guarde las cajas en las que vienen los medicamentos de uso tpico para ayudarle a seguir las instrucciones sobre dnde y cmo usarlos. Las farmacias generalmente imprimen las instrucciones del medicamento slo en las cajas y no directamente en los tubos del [place].   Si su medicamento es muy caro, por favor, pngase en contacto con Zigmund Daniel llamando al [phone] y presione la opcin 4 o envenos un mensaje a travs de Pharmacist, community.   No podemos decirle cul ser su copago  por los medicamentos por adelantado ya que esto es diferente dependiendo de la cobertura de su seguro. Sin embargo, es posible que podamos encontrar un medicamento sustituto a Electrical engineer un formulario para que el seguro cubra el medicamento que se considera necesario.   Si se requiere una autorizacin previa para que su compaa de seguros [place] su medicamento, por favor permtanos de 1 a 2 das hbiles para completar este proceso.  Los precios de los medicamentos varan con frecuencia dependiendo del Environmental consultant de dnde se surte la receta y alguna farmacias pueden ofrecer precios ms baratos.  El sitio web www.goodrx.com tiene cupones para medicamentos de Airline pilot. Los precios aqu no tienen en cuenta lo que podra costar con la ayuda del seguro (puede ser ms barato con su seguro), pero el sitio web puede darle el precio si no utiliz Research scientist (physical sciences).  - Puede imprimir el cupn correspondiente y llevarlo con su receta a la farmacia.  -  Tambin puede pasar por nuestra oficina durante el horario de atencin regular y Charity fundraiser una tarjeta de cupones de GoodRx.  - Si necesita que su receta se enve electrnicamente a una farmacia diferente, informe a nuestra oficina a travs de MyChart de [place] o por telfono llamando al [phone] y presione la opcin 4.

## 2022-09-14 NOTE — Progress Notes (Signed)
   Follow-Up Visit   Subjective  Travis Mahoney is a 56 y.o. male who presents for the following: Rash (3 months f/u on rash on the face, treating with Mupirocin ointment with a good response, patient report face improving with Mupirocin ointment.).  The following portions of the chart were reviewed this encounter and updated as appropriate:   Tobacco  Allergies  Meds  Problems  Med Hx  Surg Hx  Fam Hx     Review of Systems:  No other skin or systemic complaints except as noted in HPI or Assessment and Plan.  Objective  Well appearing patient in no apparent distress; mood and affect are within normal limits.  A focused examination was performed including face. Relevant physical exam findings are noted in the Assessment and Plan.  right cheek Discoloration        Assessment & Plan  Rash - resolved with remaining postinflammatory pigment alteration (PIPA) This may have initially been eczema versus impetigo Negative HSV antibodies, so not consistent with fever blisters right cheek  Chronic and persistent condition with duration or expected duration over one year. Condition is symptomatic / bothersome to patient. Not to goal.   Samples of Opzelura given use once a day  Start Mupirocin ointment apply inside each nostril at bedtime for 2 weeks   Discussed HSV labs results negative dated 07/13/2022  If no better or any area worsens call here to be worked in the schedule for a biopsy.  Related Medications Ruxolitinib Phosphate (OPZELURA) 1.5 % CREA Apply to affected skin once a day  Return if symptoms worsen or fail to improve.  IMarye Round, CMA, am acting as scribe for Sarina Ser, MD .  Documentation: I have reviewed the above documentation for accuracy and completeness, and I agree with the above.  Sarina Ser, MD

## 2022-09-29 ENCOUNTER — Encounter: Payer: Self-pay | Admitting: Dermatology

## 2022-10-21 ENCOUNTER — Other Ambulatory Visit: Payer: Self-pay | Admitting: Family Medicine

## 2022-10-21 DIAGNOSIS — E7800 Pure hypercholesterolemia, unspecified: Secondary | ICD-10-CM

## 2022-11-12 ENCOUNTER — Emergency Department
Admission: EM | Admit: 2022-11-12 | Discharge: 2022-11-12 | Disposition: A | Discharge status: Home / Self Care | Payer: 59 | Attending: Emergency Medicine | Admitting: Emergency Medicine

## 2022-11-12 ENCOUNTER — Emergency Department: Payer: 59

## 2022-11-12 ENCOUNTER — Other Ambulatory Visit: Payer: Self-pay

## 2022-11-12 DIAGNOSIS — R1031 Right lower quadrant pain: Secondary | ICD-10-CM | POA: Diagnosis present

## 2022-11-12 DIAGNOSIS — D72829 Elevated white blood cell count, unspecified: Secondary | ICD-10-CM | POA: Diagnosis not present

## 2022-11-12 DIAGNOSIS — E7800 Pure hypercholesterolemia, unspecified: Secondary | ICD-10-CM

## 2022-11-12 DIAGNOSIS — N3090 Cystitis, unspecified without hematuria: Secondary | ICD-10-CM | POA: Diagnosis not present

## 2022-11-12 LAB — COMPREHENSIVE METABOLIC PANEL
ALT: 24 U/L (ref 0–44)
AST: 28 U/L (ref 15–41)
Albumin: 4.5 g/dL (ref 3.5–5.0)
Alkaline Phosphatase: 59 U/L (ref 38–126)
Anion gap: 9 (ref 5–15)
BUN: 15 mg/dL (ref 6–20)
CO2: 26 mmol/L (ref 22–32)
Calcium: 9.3 mg/dL (ref 8.9–10.3)
Chloride: 100 mmol/L (ref 98–111)
Creatinine, Ser: 1.05 mg/dL (ref 0.61–1.24)
GFR, Estimated: >60 mL/min (ref >60)
Glucose, Bld: 139 mg/dL — ABNORMAL HIGH (ref 70–99)
Potassium: 3.7 mmol/L (ref 3.5–5.1)
Sodium: 135 mmol/L (ref 135–145)
Total Bilirubin: 2 mg/dL — ABNORMAL HIGH (ref 0.3–1.2)
Total Protein: 7.5 g/dL (ref 6.5–8.1)

## 2022-11-12 LAB — URINALYSIS, ROUTINE W REFLEX MICROSCOPIC
Bilirubin Urine: NEGATIVE
Glucose, UA: NEGATIVE mg/dL
Ketones, ur: 5 mg/dL — AB
Nitrite: POSITIVE — AB
Protein, ur: >=300 mg/dL — AB
RBC / HPF: >50 RBC/hpf (ref 0–5)
Specific Gravity, Urine: 1.019 (ref 1.005–1.030)
WBC, UA: >50 WBC/hpf (ref 0–5)
pH: 6 (ref 5.0–8.0)

## 2022-11-12 LAB — CBC
HCT: 43.6 % (ref 39.0–52.0)
Hemoglobin: 15.4 g/dL (ref 13.0–17.0)
MCH: 30.9 pg (ref 26.0–34.0)
MCHC: 35.3 g/dL (ref 30.0–36.0)
MCV: 87.6 fL (ref 80.0–100.0)
Platelets: 193 10*3/uL (ref 150–400)
RBC: 4.98 MIL/uL (ref 4.22–5.81)
RDW: 11.4 % — ABNORMAL LOW (ref 11.5–15.5)
WBC: 14.6 10*3/uL — ABNORMAL HIGH (ref 4.0–10.5)
nRBC: 0 % (ref 0.0–0.2)

## 2022-11-12 LAB — LIPASE, BLOOD: Lipase: 37 U/L (ref 11–51)

## 2022-11-12 MED ORDER — ATORVASTATIN CALCIUM 20 MG PO TABS: ORAL_TABLET | ORAL | 0 refills | Status: DC

## 2022-11-12 MED ORDER — IOHEXOL 300 MG/ML  SOLN
100.0000 mL | Freq: Once | INTRAMUSCULAR | Status: AC | PRN
  Administered 2022-11-12: 100 mL via INTRAVENOUS

## 2022-11-12 MED ORDER — SULFAMETHOXAZOLE-TRIMETHOPRIM 800-160 MG PO TABS: 1.0000 | ORAL_TABLET | Freq: Two times a day (BID) | ORAL | 0 refills | Status: DC

## 2022-11-12 MED ORDER — TAMSULOSIN HCL 0.4 MG PO CAPS: ORAL_CAPSULE | ORAL | 0 refills | Status: DC

## 2022-11-12 NOTE — ED Triage Notes (Signed)
Pt here with RLQ abd pain. Pt states he is also having trouble urinating, only a few dribbles when he goes. Pt has had some diarrhea but denies N/V.

## 2022-11-12 NOTE — ED Provider Notes (Signed)
[facility] Provider Note    Event Date/Time   First MD Initiated Contact with Patient 11/12/22 [phone]     (approximate)   History   Chief Complaint: Abdominal Pain   HPI  Travis Mahoney is a 57 y.o. male with a history of of BPH who comes to the ED complaining of right lower quadrant abdominal pain, gradual onset and worsening over the past 2 days.  Constant.  No aggravating or alleviating factors.  Also reports difficulty with urinating characterized by trouble starting stream and weak stream.  Reports this has been chronic.  Also reports fatigue, not having the energy to work out anymore, some unintentional weight gain.  Outside records reviewed, noting that patient was seen by dermatology September 14, 2022 and previously being treated by dermatology for a rash over the bridge of his nose which was determined to be impetigo versus eczema.  Also seen by urology April 16, 2021, noted to have relatively mild BPH symptoms at that time, continued on Flomax 0.4 mg.  There is a plan to increase his Flomax to a higher dose if voiding symptoms worsen     Physical Exam   Triage Vital Signs: ED Triage Vitals  Enc Vitals Group     BP 11/12/22 0935 (!) 115/91     Pulse Rate 11/12/22 0934 (!) 102     Resp 11/12/22 0934 20     Temp 11/12/22 0934 98.8 F (37.1 C)     Temp Source 11/12/22 0934 Oral     SpO2 11/12/22 0934 94 %     Weight 11/12/22 0935 212 lb 1.3 oz (96.2 kg)     Height 11/12/22 0935 5' 9"$  (1.753 m)     Head Circumference --      Peak Flow --      Pain Score 11/12/22 0935 6     Pain Loc --      Pain Edu? --      Excl. in [place]? --     Most recent vital signs: Vitals:   11/12/22 0935 11/12/22 1123  BP: (!) 115/91 114/63  Pulse:  89  Resp:  16  Temp:    SpO2:  94%    General: Awake, no distress.  CV:  Good peripheral perfusion.  Resp:  Normal effort.  Abd:  No distention.  Soft with suprapubic and right lower quadrant  tenderness Other:  Moist oral mucosa.  No rash.   ED Results / Procedures / Treatments   Labs (all labs ordered are listed, but only abnormal results are displayed) Labs Reviewed  COMPREHENSIVE METABOLIC PANEL - Abnormal; Notable for the following components:      Result Value   Glucose, Bld 139 (*)    Total Bilirubin 2.0 (*)    All other components within normal limits  CBC - Abnormal; Notable for the following components:   WBC 14.6 (*)    RDW 11.4 (*)    All other components within normal limits  URINALYSIS, ROUTINE W REFLEX MICROSCOPIC - Abnormal; Notable for the following components:   Color, Urine AMBER (*)    APPearance TURBID (*)    Hgb urine dipstick MODERATE (*)    Ketones, ur 5 (*)    Protein, ur >=300 (*)    Nitrite POSITIVE (*)    Leukocytes,Ua LARGE (*)    Bacteria, UA RARE (*)    All other components within normal limits  LIPASE, BLOOD  ANA W/REFLEX IF POSITIVE  EKG Interpreted by me Sinus rhythm rate of 96.  Right axis, normal intervals.  Normal QRS ST segments and T waves.   RADIOLOGY CT abdomen pelvis interpreted by me, negative for ureterolithiasis or hydronephrosis.  Radiology report reviewed   PROCEDURES:  Procedures   MEDICATIONS ORDERED IN ED: Medications  iohexol (OMNIPAQUE) 300 MG/ML solution 100 mL (100 mLs Intravenous Contrast Given 11/12/22 1030)     IMPRESSION / MDM / ASSESSMENT AND PLAN / ED COURSE  I reviewed the triage vital signs and the nursing notes.  DDx: Appendicitis, urinary retention, UTI, ureterolithiasis  Patient's presentation is most consistent with acute presentation with potential threat to life or bodily function.  Patient presents with worsening urinary symptoms, cloudy urine which she reports is malodorous.  Also right lower quadrant pain and tenderness.  CBC shows a leukocytosis of 14,000.  Will obtain CT scan to evaluate for appendicitis.  Urinalysis does show signs of  cystitis.   ----------------------------------------- 12:06 AM on 11/15/2022 ----------------------------------------- CT unremarkable.  No pyelonephritis, no signs of sepsis.  Creatinine is normal.  Stable for discharge home with oral antibiotics for UTI.      FINAL CLINICAL IMPRESSION(S) / ED DIAGNOSES   Final diagnoses:  Cystitis     Rx / DC Orders   ED Discharge Orders          Ordered    atorvastatin (LIPITOR) 20 MG tablet        11/12/22 1105    tamsulosin (FLOMAX) 0.4 MG CAPS capsule       Note to Pharmacy: ZERO refills remain on this prescription. Your patient is requesting advance approval of refills for this medication to [facility]   11/12/22 1105    sulfamethoxazole-trimethoprim (BACTRIM DS) 800-160 MG tablet  2 times daily        11/12/22 1105             Note:  This document was prepared using Dragon voice recognition software and may include unintentional dictation errors.   [name], MD 11/15/22 [phone]

## 2022-11-14 LAB — ANA W/REFLEX IF POSITIVE: Anti Nuclear Antibody (ANA): NEGATIVE

## 2023-02-01 NOTE — Progress Notes (Unsigned)
     I,Travis Mahoney,acting as a Neurosurgeon for [employer], PA-C.,have documented all relevant documentation on the behalf of Travis Ferguson, PA-C,as directed by  Travis Ferguson, PA-C while in the presence of Travis Ferguson, PA-C.   Established patient visit   Patient: Travis Mahoney   DOB: 01/20/1966   57 y.o. Male  MRN: [id number] Visit Date: 02/02/2023  Today's healthcare provider: [name], PA-C   No chief complaint on file.  Subjective    HPI  Vitamin D deficiency, follow-up  Lab Results  Component Value Date   VD25OH 19.5 (L) 03/25/2022   CALCIUM 9.3 11/12/2022   CALCIUM 9.6 03/25/2022        Wt Readings from Last 3 Encounters:  11/12/22 212 lb 1.3 oz (96.2 kg)  03/25/22 212 lb (96.2 kg)  03/08/22 214 lb 1.6 oz (97.1 kg)    He was last seen for vitamin D deficiency 11 months ago.  Management since that visit includes continue current treatment.  Symptoms: {Yes/No:20286} change in energy level {Yes/No:20286} numbness or tingling  {Yes/No:20286} bone pain {Yes/No:20286} unexplained fracture  ---------------------------------------------------------------------------------------------------  Lipid/Cholesterol, Follow-up  Last lipid panel Other pertinent labs  Lab Results  Component Value Date   CHOL 217 (H) 03/25/2022   HDL 53 03/25/2022   LDLCALC 152 (H) 03/25/2022   TRIG 69 03/25/2022   CHOLHDL 4.1 03/25/2022   Lab Results  Component Value Date   ALT 24 11/12/2022   AST 28 11/12/2022   PLT 193 11/12/2022   TSH 1.880 03/25/2022     He was last seen for this 11 months ago.  Management since that visit includes continue statin.  Symptoms: {Yes/No:20286} chest pain {Yes/No:20286} chest pressure/discomfort  {Yes/No:20286} dyspnea {Yes/No:20286} lower extremity edema  {Yes/No:20286} numbness or tingling of extremity {Yes/No:20286} orthopnea  {Yes/No:20286} palpitations {Yes/No:20286} paroxysmal nocturnal dyspnea  {Yes/No:20286} speech difficulty  {Yes/No:20286} syncope   The 10-year ASCVD risk score (Arnett DK, et al., 2019) is: 6% ---------------------------------------------------------------------------------------------------   Medications: Outpatient Medications Prior to Visit  Medication Sig   atorvastatin (LIPITOR) 20 MG tablet TAKE 1 TABLET(20 MG) BY MOUTH DAILY   meloxicam (MOBIC) 15 MG tablet Take 15 mg by mouth daily.   mometasone (ELOCON) 0.1 % cream Apply to aa's rash QD-BID PRN up to 5d/wk M-F.   Ruxolitinib Phosphate (OPZELURA) 1.5 % CREA Apply to affected skin once a day   sulfamethoxazole-trimethoprim (BACTRIM DS) 800-160 MG tablet Take 1 tablet by mouth 2 (two) times daily.   tamsulosin (FLOMAX) 0.4 MG CAPS capsule TAKE 2 CAPSULES(0.8 MG) BY MOUTH DAILY   Vitamin D, Ergocalciferol, (DRISDOL) 1.25 MG (50000 UNIT) CAPS capsule Take 1 capsule (50,000 Units total) by mouth every 7 (seven) days. After completion; return to PCP for repeat Vit D testing.   No facility-administered medications prior to visit.    Review of Systems  {Labs  Heme  Chem  Endocrine  Serology  Results Review (optional):23779}   Objective    There were no vitals taken for this visit. {Show previous vital signs (optional):23777}  Physical Exam  ***  No results found for any visits on 02/02/23.  Assessment & Plan     ***  No follow-ups on file.      {provider attestation***:1}   Travis Ferguson, PA-C  [facility] [phone] (phone) [phone] (fax)  [facility]

## 2023-02-02 ENCOUNTER — Encounter: Payer: Self-pay | Admitting: Physician Assistant

## 2023-02-02 ENCOUNTER — Ambulatory Visit (INDEPENDENT_AMBULATORY_CARE_PROVIDER_SITE_OTHER): Payer: 59 | Admitting: Physician Assistant

## 2023-02-02 VITALS — BP 107/81 | HR 88 | Ht 69.0 in | Wt 208.8 lb

## 2023-02-02 DIAGNOSIS — E559 Vitamin D deficiency, unspecified: Secondary | ICD-10-CM | POA: Diagnosis not present

## 2023-02-02 DIAGNOSIS — R739 Hyperglycemia, unspecified: Secondary | ICD-10-CM

## 2023-02-02 DIAGNOSIS — E7800 Pure hypercholesterolemia, unspecified: Secondary | ICD-10-CM | POA: Diagnosis not present

## 2023-02-02 NOTE — Assessment & Plan Note (Signed)
Managed on atorvastatin 20 mg  Repeat fasting lipids today And refill/change dose as indicated The 10-year ASCVD risk score (Arnett DK, et al., 2019) is: 5.3%

## 2023-02-03 ENCOUNTER — Other Ambulatory Visit: Payer: Self-pay | Admitting: Physician Assistant

## 2023-02-03 DIAGNOSIS — E7800 Pure hypercholesterolemia, unspecified: Secondary | ICD-10-CM

## 2023-02-03 DIAGNOSIS — E559 Vitamin D deficiency, unspecified: Secondary | ICD-10-CM

## 2023-02-03 LAB — LIPID PANEL
Chol/HDL Ratio: 3.1 ratio (ref 0.0–5.0)
Cholesterol, Total: 142 mg/dL (ref 100–199)
HDL: 46 mg/dL (ref >39)
LDL Chol Calc (NIH): 82 mg/dL (ref 0–99)
Triglycerides: 71 mg/dL (ref 0–149)
VLDL Cholesterol Cal: 14 mg/dL (ref 5–40)

## 2023-02-03 LAB — CBC WITH DIFFERENTIAL/PLATELET
Basophils Absolute: 0 10*3/uL (ref 0.0–0.2)
Basos: 1 %
EOS (ABSOLUTE): 0.2 10*3/uL (ref 0.0–0.4)
Eos: 3 %
Hematocrit: 48.5 % (ref 37.5–51.0)
Hemoglobin: 16.2 g/dL (ref 13.0–17.7)
Immature Grans (Abs): 0 10*3/uL (ref 0.0–0.1)
Immature Granulocytes: 0 %
Lymphocytes Absolute: 1.8 10*3/uL (ref 0.7–3.1)
Lymphs: 26 %
MCH: 31.1 pg (ref 26.6–33.0)
MCHC: 33.4 g/dL (ref 31.5–35.7)
MCV: 93 fL (ref 79–97)
Monocytes Absolute: 0.7 10*3/uL (ref 0.1–0.9)
Monocytes: 10 %
Neutrophils Absolute: 4.2 10*3/uL (ref 1.4–7.0)
Neutrophils: 60 %
Platelets: 214 10*3/uL (ref 150–450)
RBC: 5.21 x10E6/uL (ref 4.14–5.80)
RDW: 11.7 % (ref 11.6–15.4)
WBC: 7 10*3/uL (ref 3.4–10.8)

## 2023-02-03 LAB — HEMOGLOBIN A1C
Est. average glucose Bld gHb Est-mCnc: 94 mg/dL
Hgb A1c MFr Bld: 4.9 % (ref 4.8–5.6)

## 2023-02-03 LAB — VITAMIN D 25 HYDROXY (VIT D DEFICIENCY, FRACTURES): Vit D, 25-Hydroxy: 22.6 ng/mL — ABNORMAL LOW (ref 30.0–100.0)

## 2023-02-03 MED ORDER — VITAMIN D (ERGOCALCIFEROL) 1.25 MG (50000 UNIT) PO CAPS: 50000.0000 [IU] | ORAL_CAPSULE | ORAL | 0 refills | Status: DC

## 2023-02-03 MED ORDER — ATORVASTATIN CALCIUM 20 MG PO TABS: ORAL_TABLET | ORAL | 3 refills | Status: AC

## 2023-02-09 ENCOUNTER — Other Ambulatory Visit: Payer: Self-pay | Admitting: Physician Assistant

## 2023-02-09 NOTE — Telephone Encounter (Signed)
Last refill 11/12/22 by Dr. Alvie Heidelberg. Please advise

## 2023-02-10 ENCOUNTER — Other Ambulatory Visit: Payer: Self-pay | Admitting: Physician Assistant

## 2023-02-10 NOTE — Telephone Encounter (Signed)
Requested medication (s) are due for refill today: yes  Requested medication (s) are on the active medication list: yes  Last refill:  11/12/22  Future visit scheduled: no  Notes to clinic:  Unable to refill per protocol, last refill by another provider.  Last refilled at the ED, routing to patient's PCP for approval. Medication on current list.     Requested Prescriptions  Pending Prescriptions Disp Refills   tamsulosin (FLOMAX) 0.4 MG CAPS capsule [Pharmacy Med Name: TAMSULOSIN 0.4MG  CAPSULES] 90 capsule 0    Sig: TAKE 2 CAPSULES(0.8 MG) BY MOUTH DAILY     Urology: Alpha-Adrenergic Blocker Passed - 02/10/2023  3:23 PM      Passed - PSA in normal range and within 360 days    Prostate Specific Ag, Serum  Date Value Ref Range Status  03/25/2022 1.4 0.0 - 4.0 ng/mL Final    Comment:    Roche ECLIA methodology. According to the American Urological Association, Serum PSA should decrease and remain at undetectable levels after radical prostatectomy. The AUA defines biochemical recurrence as an initial PSA value 0.2 ng/mL or greater followed by a subsequent confirmatory PSA value 0.2 ng/mL or greater. Values obtained with different assay methods or kits cannot be used interchangeably. Results cannot be interpreted as absolute evidence of the presence or absence of malignant disease.          Passed - Last BP in normal range    BP Readings from Last 1 Encounters:  02/02/23 107/81         Passed - Valid encounter within last 12 months    Recent Outpatient Visits           1 week ago Hypercholesterolemia   [facility] [name], PA-C   10 months ago Memory deficit   [facility] Malva Limes, MD   11 months ago Skin lesion   [facility] [name], PA-C   2 years ago Sleep apnea, unspecified type   [facility] Osvaldo Angst M, [place]   2 years ago Benign  prostatic hyperplasia, unspecified whether lower urinary tract symptoms present   [facility] [place], Ricki Rodriguez [place], [place]

## 2023-02-13 ENCOUNTER — Telehealth: Payer: Self-pay | Admitting: Physician Assistant

## 2023-02-13 NOTE — Telephone Encounter (Signed)
[facility] requesting prior authorization Key: [id number] Name: [place] Tamsulosin HCI 0.4MG  capsules

## 2023-02-14 ENCOUNTER — Other Ambulatory Visit: Payer: Self-pay | Admitting: Physician Assistant

## 2023-02-14 DIAGNOSIS — N401 Enlarged prostate with lower urinary tract symptoms: Secondary | ICD-10-CM

## 2023-02-14 MED ORDER — TAMSULOSIN HCL 0.4 MG PO CAPS: 0.8000 mg | ORAL_CAPSULE | Freq: Every day | ORAL | 0 refills | Status: DC

## 2023-02-14 NOTE — Telephone Encounter (Signed)
Resent rx with different #

## 2023-08-09 ENCOUNTER — Ambulatory Visit: Payer: 59 | Admitting: Family Medicine

## 2023-08-09 ENCOUNTER — Encounter: Payer: Self-pay | Admitting: Family Medicine

## 2023-08-09 VITALS — BP 110/81 | HR 80 | Ht 69.0 in | Wt 215.7 lb

## 2023-08-09 DIAGNOSIS — E6609 Other obesity due to excess calories: Secondary | ICD-10-CM | POA: Insufficient documentation

## 2023-08-09 DIAGNOSIS — E66811 Obesity, class 1: Secondary | ICD-10-CM

## 2023-08-09 DIAGNOSIS — E7800 Pure hypercholesterolemia, unspecified: Secondary | ICD-10-CM

## 2023-08-09 DIAGNOSIS — E559 Vitamin D deficiency, unspecified: Secondary | ICD-10-CM

## 2023-08-09 DIAGNOSIS — R5382 Chronic fatigue, unspecified: Secondary | ICD-10-CM | POA: Diagnosis not present

## 2023-08-09 DIAGNOSIS — R030 Elevated blood-pressure reading, without diagnosis of hypertension: Secondary | ICD-10-CM

## 2023-08-09 DIAGNOSIS — R3916 Straining to void: Secondary | ICD-10-CM

## 2023-08-09 DIAGNOSIS — L989 Disorder of the skin and subcutaneous tissue, unspecified: Secondary | ICD-10-CM

## 2023-08-09 DIAGNOSIS — N401 Enlarged prostate with lower urinary tract symptoms: Secondary | ICD-10-CM

## 2023-08-09 DIAGNOSIS — Z6831 Body mass index (BMI) 31.0-31.9, adult: Secondary | ICD-10-CM

## 2023-08-09 NOTE — Progress Notes (Unsigned)
Established patient visit   Patient: Travis Mahoney   DOB: 1966/05/03   57 y.o. Male  MRN: [id number] Visit Date: 08/09/2023  Today's healthcare provider: Jacky Kindle, FNP  Introduced to nurse practitioner role and practice setting.  All questions answered.  Discussed provider/patient relationship and expectations.  Chief Complaint  Patient presents with   Hypertension    Patient reports 191/132 one day while at work and had to go home. Reports it has come down every day since. Report readings of 117-135/77-89 the next few days after   Subjective    HPI HPI     Hypertension   The problem is controlled.  Compliance with treatment: No current treatment.  Anxiety: Absent.  Blurred vision: Absent.  Chest pain: Absent.  Chest pressure/discomfort: Absent.  Dyspnea: Absent.  Headaches: Present.  Lower extremity edema: Absent.  Orthopnea: Absent.  Palpitations: Absent.  Paroxysmal nocturnal dyspnea: Absent.  Syncope: Absent.  The patient never exercises (Not in the last nine months).  Patient's diet is low salt and generally healthy. Additional comments: Patient reports 191/132 one day while at work and had to go home. Reports it has come down every day since. Report readings of 117-135/77-89 the next few days after        Comments   Patient reports spot on top of left ear and he would like to make sure it is nothing. Reports he will scratch it and it comes right back. Describes it as callus like hard. Present for 1 year      Last edited by Acey Lav, CMA on 08/09/2023  8:09 AM.      Medications: Outpatient Medications Prior to Visit  Medication Sig   atorvastatin (LIPITOR) 20 MG tablet TAKE 1 TABLET(20 MG) BY MOUTH DAILY   [DISCONTINUED] tamsulosin (FLOMAX) 0.4 MG CAPS capsule Take 2 capsules (0.8 mg total) by mouth daily.   [DISCONTINUED] Vitamin D, Ergocalciferol, (DRISDOL) 1.25 MG (50000 UNIT) CAPS capsule Take 1 capsule (50,000 Units total) by mouth every 7 (seven)  days. After completion; return to PCP for repeat Vit D testing.   No facility-administered medications prior to visit.     Objective    BP 110/81 (BP Location: Right Arm, Patient Position: Sitting, Cuff Size: Large)   Pulse 80   Ht 5\' 9"  (1.753 m)   Wt 215 lb 11.2 oz (97.8 kg)   SpO2 99%   BMI 31.85 kg/m   Physical Exam Vitals and nursing note reviewed.  Constitutional:      Appearance: Normal appearance. He is obese. He is not ill-appearing.  HENT:     Head: Normocephalic and atraumatic.  Cardiovascular:     Rate and Rhythm: Normal rate and regular rhythm.     Pulses: Normal pulses.     Heart sounds: Normal heart sounds.  Pulmonary:     Effort: Pulmonary effort is normal.     Breath sounds: Normal breath sounds.  Musculoskeletal:        General: Normal range of motion.     Cervical back: Normal range of motion.     Right lower leg: No edema.     Left lower leg: No edema.  Skin:    General: Skin is warm and dry.     Capillary Refill: Capillary refill takes less than 2 seconds.  Neurological:     General: No focal deficit present.     Mental Status: He is alert and oriented to person, place, and time. Mental status  is at baseline.  Psychiatric:        Mood and Affect: Mood normal.        Behavior: Behavior normal.        Thought Content: Thought content normal.        Judgment: Judgment normal.     Results for orders placed or performed in visit on 08/09/23  Testosterone,Free and Total  Result Value Ref Range   Testosterone 483 264 - 916 ng/dL   Testosterone, Free WILL FOLLOW   CBC with Differential/Platelet  Result Value Ref Range   WBC 6.2 3.4 - 10.8 x10E3/uL   RBC 5.27 4.14 - 5.80 x10E6/uL   Hemoglobin 16.4 13.0 - 17.7 g/dL   Hematocrit 78.4 69.6 - 51.0 %   MCV 94 79 - 97 fL   MCH 31.1 26.6 - 33.0 pg   MCHC 33.1 31.5 - 35.7 g/dL   RDW 29.5 28.4 - 13.2 %   Platelets 218 150 - 450 x10E3/uL   Neutrophils 57 Not Estab. %   Lymphs 30 Not Estab. %    Monocytes 9 Not Estab. %   Eos 3 Not Estab. %   Basos 1 Not Estab. %   Neutrophils Absolute 3.5 1.4 - 7.0 x10E3/uL   Lymphocytes Absolute 1.8 0.7 - 3.1 x10E3/uL   Monocytes Absolute 0.6 0.1 - 0.9 x10E3/uL   EOS (ABSOLUTE) 0.2 0.0 - 0.4 x10E3/uL   Basophils Absolute 0.1 0.0 - 0.2 x10E3/uL   Immature Granulocytes 0 Not Estab. %   Immature Grans (Abs) 0.0 0.0 - 0.1 x10E3/uL  Comprehensive Metabolic Panel (CMET)  Result Value Ref Range   Glucose 103 (H) 70 - 99 mg/dL   BUN 16 6 - 24 mg/dL   Creatinine, Ser 4.40 0.76 - 1.27 mg/dL   eGFR 74 >10 UV/OZD/6.64   BUN/Creatinine Ratio 14 9 - 20   Sodium 137 134 - 144 mmol/L   Potassium 4.1 3.5 - 5.2 mmol/L   Chloride 98 96 - 106 mmol/L   CO2 22 20 - 29 mmol/L   Calcium 9.5 8.7 - 10.2 mg/dL   Total Protein 6.9 6.0 - 8.5 g/dL   Albumin 4.6 3.8 - 4.9 g/dL   Globulin, Total 2.3 1.5 - 4.5 g/dL   Bilirubin Total 0.6 0.0 - 1.2 mg/dL   Alkaline Phosphatase 74 44 - 121 IU/L   AST 30 0 - 40 IU/L   ALT 29 0 - 44 IU/L  Vitamin D (25 hydroxy)  Result Value Ref Range   Vit D, 25-Hydroxy 28.0 (L) 30.0 - 100.0 ng/mL  Lipid panel  Result Value Ref Range   Cholesterol, Total 142 100 - 199 mg/dL   Triglycerides 69 0 - 149 mg/dL   HDL 41 >40 mg/dL   VLDL Cholesterol Cal 14 5 - 40 mg/dL   LDL Chol Calc (NIH) 87 0 - 99 mg/dL   Chol/HDL Ratio 3.5 0.0 - 5.0 ratio  PSA  Result Value Ref Range   Prostate Specific Ag, Serum 1.8 0.0 - 4.0 ng/mL  TSH  Result Value Ref Range   TSH 2.050 0.450 - 4.500 uIU/mL    Assessment & Plan     Problem List Items Addressed This Visit       Musculoskeletal and Integument   Skin lesion of scalp    Recommend establish care with derm; change in appearance. Waxes/wanes. Pt reports no hair on scalp >10 years. Reports almost daily shaving.       Relevant Orders   Ambulatory referral to  Dermatology     Genitourinary   Benign prostatic hyperplasia    Chronic; increase in urinary symptoms Repeat PSA Remains on  Flomax at 0.8      Relevant Medications   tamsulosin (FLOMAX) 0.4 MG CAPS capsule   Other Relevant Orders   PSA (Completed)     Other   Avitaminosis D    Chronic; repeat labs given ongoing fatigue      Relevant Orders   Vitamin D (25 hydroxy) (Completed)   Chronic fatigue    Acute on chronic; worsening with change in sexual drive Will repeat testosterone and vit d to assist standard lab values      Relevant Orders   Testosterone,Free and Total (Completed)   Class 1 obesity due to excess calories with serious comorbidity and body mass index (BMI) of 31.0 to 31.9 in adult    Chronic; Body mass index is 31.85 kg/m. Discussed importance of healthy weight management Discussed diet and exercise       Elevated blood pressure reading without diagnosis of hypertension - Primary    Pt reports BP of 190s following high salt intake; initial presentation including acute head pain which prompted BP check Continue to monitor; remains Stage I HTN at this time Defer medication start while pt works on lifestyle mgmt      Relevant Orders   CBC with Differential/Platelet (Completed)   Comprehensive Metabolic Panel (CMET) (Completed)   TSH (Completed)   Hypercholesterolemia    Chronic; previously on lipitor 20 Repeat LP recommend diet low in saturated fat and regular exercise - 30 min at least 5 times per week The 10-year ASCVD risk score (Arnett DK, et al., 2019) is: 5.3% This level has slightly increased from previous calculation      Relevant Orders   Lipid panel (Completed)    The 10-year ASCVD risk score (Arnett DK, et al., 2019) is: 5.3%  Return in about 6 months (around 02/06/2024) for annual examination.     Leilani Merl, FNP, have reviewed all documentation for this visit. The documentation on 08/10/23 for the exam, diagnosis, procedures, and orders are all accurate and complete.  Jacky Kindle, FNP  [facility] [phone]  (phone) [phone] (fax)  [facility]

## 2023-08-09 NOTE — Patient Instructions (Signed)
The CDC recommends two doses of Shingrix (the new shingles vaccine) separated by 2 to 6 months for adults age 58 years and older. I recommend checking with your insurance plan regarding coverage for this vaccine.    

## 2023-08-10 ENCOUNTER — Other Ambulatory Visit: Payer: Self-pay | Admitting: Family Medicine

## 2023-08-10 ENCOUNTER — Telehealth: Payer: Self-pay

## 2023-08-10 DIAGNOSIS — F520 Hypoactive sexual desire disorder: Secondary | ICD-10-CM

## 2023-08-10 DIAGNOSIS — E349 Endocrine disorder, unspecified: Secondary | ICD-10-CM

## 2023-08-10 DIAGNOSIS — L989 Disorder of the skin and subcutaneous tissue, unspecified: Secondary | ICD-10-CM | POA: Insufficient documentation

## 2023-08-10 DIAGNOSIS — E559 Vitamin D deficiency, unspecified: Secondary | ICD-10-CM

## 2023-08-10 MED ORDER — VITAMIN D3 125 MCG (5000 UT) PO CAPS: 5000.0000 [IU] | ORAL_CAPSULE | Freq: Every day | ORAL | 1 refills | Status: DC

## 2023-08-10 MED ORDER — TAMSULOSIN HCL 0.4 MG PO CAPS: 0.8000 mg | ORAL_CAPSULE | Freq: Every day | ORAL | 3 refills | Status: DC

## 2023-08-10 NOTE — Assessment & Plan Note (Signed)
Chronic; previously on lipitor 20 Repeat LP recommend diet low in saturated fat and regular exercise - 30 min at least 5 times per week The 10-year ASCVD risk score (Arnett DK, et al., 2019) is: 5.3% This level has slightly increased from previous calculation

## 2023-08-10 NOTE — Assessment & Plan Note (Signed)
Chronic; repeat labs given ongoing fatigue

## 2023-08-10 NOTE — Progress Notes (Signed)
Total testosterone has decreased in last year; however, remains in normal range. Free testosterone is pending. If urology referral is still desired; please let us know so we can proceed with a referral request.  All other labs are stable; The 10-year ASCVD risk score (Arnett DK, et al., 2019) is: 5.3%  Vit D has improved; will change to daily supplement.

## 2023-08-10 NOTE — Assessment & Plan Note (Signed)
Chronic; increase in urinary symptoms Repeat PSA Remains on Flomax at 0.8

## 2023-08-10 NOTE — Assessment & Plan Note (Signed)
Acute on chronic; worsening with change in sexual drive Will repeat testosterone and vit d to assist standard lab values

## 2023-08-10 NOTE — Assessment & Plan Note (Signed)
Recommend establish care with derm; change in appearance. Waxes/wanes. Pt reports no hair on scalp >10 years. Reports almost daily shaving.

## 2023-08-10 NOTE — Telephone Encounter (Signed)
 Copied from CRM [id number]. Topic: General - Other >> Aug 10, 2023 10:31 AM Phill Myron wrote: Per our discussion yesterday , I would like to go forward in taking the high Blood pressure medication. Please advise

## 2023-08-10 NOTE — Assessment & Plan Note (Signed)
Chronic; Body mass index is 31.85 kg/m. Discussed importance of healthy weight management Discussed diet and exercise

## 2023-08-10 NOTE — Assessment & Plan Note (Signed)
Pt reports BP of 190s following high salt intake; initial presentation including acute head pain which prompted BP check Continue to monitor; remains Stage I HTN at this time Defer medication start while pt works on lifestyle mgmt

## 2023-08-13 LAB — LIPID PANEL
Chol/HDL Ratio: 3.5 ratio (ref 0.0–5.0)
Cholesterol, Total: 142 mg/dL (ref 100–199)
HDL: 41 mg/dL (ref >39)
LDL Chol Calc (NIH): 87 mg/dL (ref 0–99)
Triglycerides: 69 mg/dL (ref 0–149)
VLDL Cholesterol Cal: 14 mg/dL (ref 5–40)

## 2023-08-13 LAB — COMPREHENSIVE METABOLIC PANEL
ALT: 29 [IU]/L (ref 0–44)
AST: 30 [IU]/L (ref 0–40)
Albumin: 4.6 g/dL (ref 3.8–4.9)
Alkaline Phosphatase: 74 [IU]/L (ref 44–121)
BUN/Creatinine Ratio: 14 (ref 9–20)
BUN: 16 mg/dL (ref 6–24)
Bilirubin Total: 0.6 mg/dL (ref 0.0–1.2)
CO2: 22 mmol/L (ref 20–29)
Calcium: 9.5 mg/dL (ref 8.7–10.2)
Chloride: 98 mmol/L (ref 96–106)
Creatinine, Ser: 1.15 mg/dL (ref 0.76–1.27)
Globulin, Total: 2.3 g/dL (ref 1.5–4.5)
Glucose: 103 mg/dL — ABNORMAL HIGH (ref 70–99)
Potassium: 4.1 mmol/L (ref 3.5–5.2)
Sodium: 137 mmol/L (ref 134–144)
Total Protein: 6.9 g/dL (ref 6.0–8.5)
eGFR: 74 mL/min/{1.73_m2} (ref >59)

## 2023-08-13 LAB — CBC WITH DIFFERENTIAL/PLATELET
Basophils Absolute: 0.1 10*3/uL (ref 0.0–0.2)
Basos: 1 %
EOS (ABSOLUTE): 0.2 10*3/uL (ref 0.0–0.4)
Eos: 3 %
Hematocrit: 49.6 % (ref 37.5–51.0)
Hemoglobin: 16.4 g/dL (ref 13.0–17.7)
Immature Grans (Abs): 0 10*3/uL (ref 0.0–0.1)
Immature Granulocytes: 0 %
Lymphocytes Absolute: 1.8 10*3/uL (ref 0.7–3.1)
Lymphs: 30 %
MCH: 31.1 pg (ref 26.6–33.0)
MCHC: 33.1 g/dL (ref 31.5–35.7)
MCV: 94 fL (ref 79–97)
Monocytes Absolute: 0.6 10*3/uL (ref 0.1–0.9)
Monocytes: 9 %
Neutrophils Absolute: 3.5 10*3/uL (ref 1.4–7.0)
Neutrophils: 57 %
Platelets: 218 10*3/uL (ref 150–450)
RBC: 5.27 x10E6/uL (ref 4.14–5.80)
RDW: 11.7 % (ref 11.6–15.4)
WBC: 6.2 10*3/uL (ref 3.4–10.8)

## 2023-08-13 LAB — TESTOSTERONE,FREE AND TOTAL
Testosterone, Free: 8.1 pg/mL (ref 7.2–24.0)
Testosterone: 483 ng/dL (ref 264–916)

## 2023-08-13 LAB — VITAMIN D 25 HYDROXY (VIT D DEFICIENCY, FRACTURES): Vit D, 25-Hydroxy: 28 ng/mL — ABNORMAL LOW (ref 30.0–100.0)

## 2023-08-13 LAB — TSH: TSH: 2.05 u[IU]/mL (ref 0.450–4.500)

## 2023-08-13 LAB — PSA: Prostate Specific Ag, Serum: 1.8 ng/mL (ref 0.0–4.0)

## 2023-08-16 ENCOUNTER — Other Ambulatory Visit: Payer: Self-pay | Admitting: Family Medicine

## 2023-08-16 MED ORDER — AMLODIPINE BESYLATE 5 MG PO TABS: 5.0000 mg | ORAL_TABLET | Freq: Every day | ORAL | 1 refills | Status: DC

## 2023-08-21 NOTE — Telephone Encounter (Signed)
Appt made for Dec 6 at 10:20. LMOM for pt to return call if not able to make it and to reschedule.

## 2023-08-22 ENCOUNTER — Ambulatory Visit: Payer: 59 | Admitting: Physician Assistant

## 2023-09-08 ENCOUNTER — Ambulatory Visit (INDEPENDENT_AMBULATORY_CARE_PROVIDER_SITE_OTHER): Payer: 59 | Admitting: Family Medicine

## 2023-09-08 DIAGNOSIS — Z91199 Patient's noncompliance with other medical treatment and regimen due to unspecified reason: Secondary | ICD-10-CM

## 2023-09-08 NOTE — Progress Notes (Unsigned)
Patient was not seen for appt d/t no call, no show, or late arrival >10 mins past appt time.   Elise T Payne, FNP  [facility] [address] [place], [place] [address] [phone] (phone) [phone] (fax) [facility]  

## 2024-01-17 ENCOUNTER — Ambulatory Visit: Payer: 59 | Admitting: Dermatology

## 2024-02-06 ENCOUNTER — Ambulatory Visit: Payer: 59 | Admitting: Family Medicine

## 2024-03-13 ENCOUNTER — Other Ambulatory Visit: Payer: Self-pay | Admitting: Physician Assistant

## 2024-03-13 DIAGNOSIS — E7800 Pure hypercholesterolemia, unspecified: Secondary | ICD-10-CM

## 2024-04-04 ENCOUNTER — Other Ambulatory Visit: Payer: Self-pay | Admitting: Physician Assistant

## 2024-04-04 ENCOUNTER — Ambulatory Visit: Admitting: Family Medicine

## 2024-04-04 DIAGNOSIS — E7800 Pure hypercholesterolemia, unspecified: Secondary | ICD-10-CM

## 2024-04-05 ENCOUNTER — Other Ambulatory Visit: Payer: Self-pay | Admitting: Physician Assistant

## 2024-04-05 DIAGNOSIS — E7800 Pure hypercholesterolemia, unspecified: Secondary | ICD-10-CM

## 2024-04-06 ENCOUNTER — Other Ambulatory Visit: Payer: Self-pay | Admitting: Physician Assistant

## 2024-04-06 DIAGNOSIS — E7800 Pure hypercholesterolemia, unspecified: Secondary | ICD-10-CM

## 2024-04-07 ENCOUNTER — Other Ambulatory Visit: Payer: Self-pay | Admitting: Physician Assistant

## 2024-04-07 DIAGNOSIS — E7800 Pure hypercholesterolemia, unspecified: Secondary | ICD-10-CM

## 2024-04-11 ENCOUNTER — Other Ambulatory Visit: Payer: Self-pay | Admitting: Family Medicine

## 2024-04-11 DIAGNOSIS — E7800 Pure hypercholesterolemia, unspecified: Secondary | ICD-10-CM

## 2024-04-11 NOTE — Telephone Encounter (Signed)
 Copied from CRM [id number]. Topic: Clinical - Medication Refill >> Apr 11, 2024  3:52 PM Carlatta H wrote: Medication: atorvastatin  (LIPITOR) 20 MG tablet  Has the patient contacted their pharmacy? Yes (Agent: If no, request that the patient contact the pharmacy for the refill. If patient does not wish to contact the pharmacy document the reason why and proceed with request.) (Agent: If yes, when and what did the pharmacy advise?)advised to contact office  This is the patient's preferred pharmacy:    [id number] [place], [place] - [address] AT [facility] OF [address] [place] [address] Phone: [phone] Fax: [phone]  Is this the correct pharmacy for this prescription? Yes If no, delete pharmacy and type the correct one.   Has the prescription been filled recently? No  Is the patient out of the medication? Yes  Has the patient been seen for an appointment in the last year OR does the patient have an upcoming appointment? No  Can we respond through MyChart? Yes  Agent: Please be advised that Rx refills may take up to 3 business days. We ask that you follow-up with your pharmacy.

## 2024-04-13 NOTE — Telephone Encounter (Signed)
 Requested medication (s) are due for refill today: yes  Requested medication (s) are on the active medication list: yes  Last refill:  02/03/23 #90 3   Future visit scheduled: no  Notes to clinic:  med was prescribed by previous PCP that left practice. Last OV pt is to be on a statin needs reordered   Requested Prescriptions  Pending Prescriptions Disp Refills   atorvastatin  (LIPITOR) 20 MG tablet 90 tablet 3    Sig: TAKE 1 TABLET(20 MG) BY MOUTH DAILY     Cardiovascular:  Antilipid - Statins Failed - 04/13/2024 10:41 AM      Failed - Valid encounter within last 12 months    Recent Outpatient Visits   None            Failed - Lipid Panel in normal range within the last 12 months    Cholesterol, Total  Date Value Ref Range Status  08/09/2023 142 100 - 199 mg/dL Final   LDL Chol Calc (NIH)  Date Value Ref Range Status  08/09/2023 87 0 - 99 mg/dL Final   HDL  Date Value Ref Range Status  08/09/2023 41 >39 mg/dL Final   Triglycerides  Date Value Ref Range Status  08/09/2023 69 0 - 149 mg/dL Final         Passed - Patient is not pregnant

## 2024-08-18 ENCOUNTER — Other Ambulatory Visit: Payer: Self-pay | Admitting: Family Medicine

## 2024-08-18 DIAGNOSIS — N401 Enlarged prostate with lower urinary tract symptoms: Secondary | ICD-10-CM

## 2024-08-19 ENCOUNTER — Other Ambulatory Visit: Payer: Self-pay

## 2024-08-19 ENCOUNTER — Telehealth: Payer: Self-pay | Admitting: Family Medicine

## 2024-08-19 DIAGNOSIS — N401 Enlarged prostate with lower urinary tract symptoms: Secondary | ICD-10-CM

## 2024-08-19 NOTE — Telephone Encounter (Signed)
 Converted

## 2024-08-19 NOTE — Telephone Encounter (Signed)
[place] faxed refill request for the following medications:  tamsulosin (FLOMAX) 0.4 MG CAPS capsule   Please advise.

## 2024-08-20 NOTE — Telephone Encounter (Signed)
 Requested medication (s) are due for refill today: yes  Requested medication (s) are on the active medication list: yes  Last refill:  08/10/23  Future visit scheduled: no  Notes to clinic:  Unable to refill per protocol, last refill by provider no longer at practice     Requested Prescriptions  Pending Prescriptions Disp Refills   tamsulosin  (FLOMAX ) 0.4 MG CAPS capsule [Pharmacy Med Name: TAMSULOSIN  0.4MG  CAPSULES] 180 capsule 3    Sig: TAKE 2 CAPSULES(0.8 MG) BY MOUTH DAILY     Urology: Alpha-Adrenergic Blocker Failed - 08/20/2024  3:57 PM      Failed - PSA in normal range and within 360 days    Prostate Specific Ag, Serum  Date Value Ref Range Status  08/09/2023 1.8 0.0 - 4.0 ng/mL Final    Comment:    Roche ECLIA methodology. According to the American Urological Association, Serum PSA should decrease and remain at undetectable levels after radical prostatectomy. The AUA defines biochemical recurrence as an initial PSA value 0.2 ng/mL or greater followed by a subsequent confirmatory PSA value 0.2 ng/mL or greater. Values obtained with different assay methods or kits cannot be used interchangeably. Results cannot be interpreted as absolute evidence of the presence or absence of malignant disease.          Failed - Valid encounter within last 12 months    Recent Outpatient Visits   None            Passed - Last BP in normal range    BP Readings from Last 1 Encounters:  08/09/23 110/81

## 2024-08-21 NOTE — Telephone Encounter (Signed)
 Received another refill request for tamsulosin  (FLOMAX ) 0.4 MG CAPS capsule from [facility] in [place].  Looks like this was refused but sent to the wrong [facility].  Can you please send refusal to [facility] in [place].

## 2024-08-27 ENCOUNTER — Other Ambulatory Visit: Payer: Self-pay | Admitting: Family Medicine

## 2024-08-27 DIAGNOSIS — N401 Enlarged prostate with lower urinary tract symptoms: Secondary | ICD-10-CM

## 2024-08-27 NOTE — Telephone Encounter (Unsigned)
 Copied from CRM #[id number]. Topic: Clinical - Medication Refill >> Aug 27, 2024  9:32 AM Tobias L wrote: Medication: tamsulosin  (FLOMAX ) 0.4 MG CAPS capsule Patient has scheduled TOC appointment with Janna for 10/23/24. Patient requesting refills until able to be seen.   Has the patient contacted their pharmacy? Yes Told to contact office for further refills.   This is the patient's preferred pharmacy:  [id number] [place], [place] - [address] AT [facility] OF [address] [place] [address] Phone: [phone] Fax: [phone]  Is this the correct pharmacy for this prescription? Yes  Has the prescription been filled recently? No  Is the patient out of the medication? No  Has the patient been seen for an appointment in the last year OR does the patient have an upcoming appointment? Yes  Can we respond through MyChart? No  Agent: Please be advised that Rx refills may take up to 3 business days. We ask that you follow-up with your pharmacy.

## 2024-08-28 NOTE — Telephone Encounter (Signed)
 Requested medication (s) are due for refill today: yes  Requested medication (s) are on the active medication list: yes  Last refill:  08/10/23  Future visit scheduled: yes  Notes to clinic:  Unable to refill per protocol, appointment needed. Patient requesting short supply until OV in Jan.     Requested Prescriptions  Pending Prescriptions Disp Refills   tamsulosin  (FLOMAX ) 0.4 MG CAPS capsule 180 capsule 3    Sig: Take 2 capsules (0.8 mg total) by mouth daily.     Urology: Alpha-Adrenergic Blocker Failed - 08/28/2024  2:03 PM      Failed - PSA in normal range and within 360 days    Prostate Specific Ag, Serum  Date Value Ref Range Status  08/09/2023 1.8 0.0 - 4.0 ng/mL Final    Comment:    Roche ECLIA methodology. According to the American Urological Association, Serum PSA should decrease and remain at undetectable levels after radical prostatectomy. The AUA defines biochemical recurrence as an initial PSA value 0.2 ng/mL or greater followed by a subsequent confirmatory PSA value 0.2 ng/mL or greater. Values obtained with different assay methods or kits cannot be used interchangeably. Results cannot be interpreted as absolute evidence of the presence or absence of malignant disease.          Failed - Valid encounter within last 12 months    Recent Outpatient Visits   None            Passed - Last BP in normal range    BP Readings from Last 1 Encounters:  08/09/23 110/81

## 2024-09-09 ENCOUNTER — Telehealth: Payer: Self-pay

## 2024-09-09 DIAGNOSIS — N401 Enlarged prostate with lower urinary tract symptoms: Secondary | ICD-10-CM

## 2024-09-09 NOTE — Telephone Encounter (Signed)
 Spoke with pt and pt stated the rx has the date of 08/2023. So the pharmacy is needing a new one due to being out of date. I called pharmacy but they are on lunch 1:30 PM - 2 PM, no response. Will call after 2 to get an update on pt rx.

## 2024-09-09 NOTE — Telephone Encounter (Unsigned)
 Copied from CRM #[id number]. Topic: Clinical - Medication Question >> Sep 06, 2024 12:16 PM [place] MATSU wrote: Reason for CRM: Patient is calling in to find out if he will be getting a refill or a bridge until his appointment as there is no sooner appt that what he is scheduled. He is an ex patient of Dr. Emilio and needs a TOC. Per patient he only has two pills left and really needs his meds.   Medication: tamsulosin  (FLOMAX ) 0.4 MG CAPS capsule  May you please advise.

## 2024-09-10 ENCOUNTER — Telehealth: Payer: Self-pay | Admitting: Physician Assistant

## 2024-09-10 MED ORDER — TAMSULOSIN HCL 0.4 MG PO CAPS: 0.8000 mg | ORAL_CAPSULE | Freq: Every day | ORAL | 0 refills | Status: DC

## 2024-09-10 NOTE — Telephone Encounter (Signed)
 Travis Mahoney stopped by today requesting that his refill be sent in today as he has no more medication. Please send to his preferred pharmacy.

## 2024-09-10 NOTE — Telephone Encounter (Signed)
 Error

## 2024-09-11 MED ORDER — TAMSULOSIN HCL 0.4 MG PO CAPS: 0.8000 mg | ORAL_CAPSULE | Freq: Every day | ORAL | 0 refills | Status: DC

## 2024-09-11 NOTE — Addendum Note (Signed)
 Addended by: Candie Gintz on: 09/11/2024 12:13 PM   Modules accepted: Orders

## 2024-10-23 ENCOUNTER — Ambulatory Visit: Payer: Self-pay | Admitting: Physician Assistant

## 2024-10-23 ENCOUNTER — Encounter: Payer: Self-pay | Admitting: Physician Assistant

## 2024-10-23 VITALS — BP 122/85 | HR 92 | Resp 16 | Ht 69.0 in | Wt 208.5 lb

## 2024-10-23 DIAGNOSIS — G4730 Sleep apnea, unspecified: Secondary | ICD-10-CM

## 2024-10-23 DIAGNOSIS — E6609 Other obesity due to excess calories: Secondary | ICD-10-CM

## 2024-10-23 DIAGNOSIS — E66811 Obesity, class 1: Secondary | ICD-10-CM

## 2024-10-23 DIAGNOSIS — R739 Hyperglycemia, unspecified: Secondary | ICD-10-CM

## 2024-10-23 DIAGNOSIS — E349 Endocrine disorder, unspecified: Secondary | ICD-10-CM

## 2024-10-23 DIAGNOSIS — Z789 Other specified health status: Secondary | ICD-10-CM

## 2024-10-23 DIAGNOSIS — R3916 Straining to void: Secondary | ICD-10-CM

## 2024-10-23 DIAGNOSIS — R5382 Chronic fatigue, unspecified: Secondary | ICD-10-CM

## 2024-10-23 DIAGNOSIS — Z6831 Body mass index (BMI) 31.0-31.9, adult: Secondary | ICD-10-CM

## 2024-10-23 DIAGNOSIS — Z23 Encounter for immunization: Secondary | ICD-10-CM

## 2024-10-23 DIAGNOSIS — R2241 Localized swelling, mass and lump, right lower limb: Secondary | ICD-10-CM

## 2024-10-23 DIAGNOSIS — E7800 Pure hypercholesterolemia, unspecified: Secondary | ICD-10-CM

## 2024-10-23 DIAGNOSIS — E559 Vitamin D deficiency, unspecified: Secondary | ICD-10-CM

## 2024-10-23 DIAGNOSIS — N401 Enlarged prostate with lower urinary tract symptoms: Secondary | ICD-10-CM

## 2024-10-23 MED ORDER — TAMSULOSIN HCL 0.4 MG PO CAPS: 0.8000 mg | ORAL_CAPSULE | Freq: Every day | ORAL | 0 refills | Status: AC

## 2024-10-23 NOTE — Progress Notes (Unsigned)
 " Established patient visit  Patient: Travis Mahoney   DOB: 04-04-1966   59 y.o. Male  MRN: [id number] Visit Date: 10/23/2024  Today's healthcare provider: Jolynn Spencer, PA-C   Chief Complaint  Patient presents with   Transitions Of Care    Patient would like to get back on Atorvastatin  for his cholesterol. Patient reports that he received Hep B vaccine at the Urgent Care a few weeks ago.  Patient declined Flu vaccine, pneumococcal vaccine and shingles vaccine today.   Medication Refill    Need refill-Flomax    Cyst   Subjective     HPI     Transitions Of Care    Additional comments: Patient would like to get back on Atorvastatin  for his cholesterol. Patient reports that he received Hep B vaccine at the Urgent Care a few weeks ago.  Patient declined Flu vaccine, pneumococcal vaccine and shingles vaccine today.        Medication Refill    Additional comments: Need refill-Flomax       Last edited by Rosas, Joseline E, CMA on 10/23/2024  2:23 PM.       Discussed the use of AI scribe software for clinical note transcription with the patient, who gave verbal consent to proceed.  History of Present Illness Travis Mahoney is a 59 year old male who presents with chronic fatigue and medication refills.  He reports chronic fatigue that has improved but persists as mild tiredness. 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 He exercises 5-6 days per week. He denies chest pain, dyspnea, vision changes, anxiety, bowel changes, or heat/cold intolerance.  He has elevated cholesterol and previously took atorvastatin . He is concerned about current cholesterol levels and is awaiting blood work.  He has BPH managed with tamsulosin  and reports no current urinary symptoms.  He has sleep apnea and was unable to tolerate CPAP. He uses a self-made mouthpiece that he feels reduces snoring and improves sleep.  About 1 week ago he injured his right lower  leg while working out and developed a sore hematoma that he describes as a bulged vein. It is tender but not draining or showing signs of infection.  He does not use tobacco, alcohol, or recreational drugs.       10/23/2024    2:23 PM 08/09/2023    8:10 AM 02/02/2023    8:11 AM  Depression screen PHQ 2/9  Decreased Interest 0 2 0  Down, Depressed, Hopeless 0 1 0  PHQ - 2 Score 0 3 0  Altered sleeping  2 0  Tired, decreased energy  3 0  Change in appetite  2 0  Feeling bad or failure about yourself   0 0  Trouble concentrating  2 0  Moving slowly or fidgety/restless  0 0  Suicidal thoughts  0 0  PHQ-9 Score  12  0   Difficult doing work/chores  Somewhat difficult Not difficult at all     Data saved with a previous flowsheet row definition      10/23/2024    2:25 PM 08/09/2023    8:11 AM  GAD 7 : Generalized Anxiety Score  Nervous, Anxious, on Edge 0 1   Control/stop worrying 0 0   Worry too much - different things 0 0   Trouble relaxing 2 2   Restless 0 0   Easily annoyed or irritable 0 1   Afraid - awful might happen 0 0   Total GAD 7 Score  2 4  Anxiety Difficulty Not difficult at all Somewhat difficult     Data saved with a previous flowsheet row definition    Medications: Show/hide medication list[1]  Review of Systems All negative Except see HPI   {Insert previous labs (optional):23779} {See past labs  Heme  Chem  Endocrine  Serology  Results Review (optional):1}   Objective    BP 122/85 (BP Location: Left Arm, Patient Position: Sitting, Cuff Size: Large)   Pulse 92   Resp 16   Ht 5' 9 (1.753 m)   Wt 208 lb 8 oz (94.6 kg)   SpO2 97%   BMI 30.79 kg/m  {Insert last BP/Wt (optional):23777}{See vitals history (optional):1}   Physical Exam Vitals reviewed.  Constitutional:      General: He is not in acute distress.    Appearance: Normal appearance. He is not diaphoretic.  HENT:     Head: Normocephalic and atraumatic.  Eyes:     General: No  scleral icterus.    Conjunctiva/sclera: Conjunctivae normal.  Cardiovascular:     Rate and Rhythm: Normal rate and regular rhythm.     Pulses: Normal pulses.     Heart sounds: Normal heart sounds. No murmur heard. Pulmonary:     Effort: Pulmonary effort is normal. No respiratory distress.     Breath sounds: Normal breath sounds. No wheezing or rhonchi.  Musculoskeletal:     Cervical back: Neck supple.     Right lower leg: No edema.     Left lower leg: No edema.  Lymphadenopathy:     Cervical: No cervical adenopathy.  Skin:    General: Skin is warm and dry.     Findings: No rash.  Neurological:     Mental Status: He is alert and oriented to person, place, and time. Mental status is at baseline.  Psychiatric:        Mood and Affect: Mood normal.        Behavior: Behavior normal.      No results found for any visits on 10/23/24.      Assessment and Plan Assessment & Plan Hypercholesterolemia Cholesterol levels require monitoring. No current cholesterol medication. - Ordered cholesterol level test. - Will prescribe atorvastatin  after reviewing lab results.  Class 1 obesity Weight management is important. Exercise routine is commendable. - Encouraged continuation of current exercise routine. - Advised on dietary modifications.  Vitamin D  deficiency Vitamin D  levels need assessment. - Ordered vitamin D  level test.  Chronic fatigue Fatigue improved but persists. Work schedule may contribute. - Ordered blood work including hemoglobin, electrolytes, and kidney function tests. - Ordered TSH test.  Benign prostatic hyperplasia with lower urinary tract symptoms Urinary symptoms managed with tamsulosin . No current urology follow-up. - Prescribed tamsulosin . - Advised to seek urology referral if symptoms worsen.  Testosterone  insufficiency Testosterone  levels need assessment. - Ordered testosterone  level test.  Sleep apnea Managed with self-made mouthpiece. - Continue  current management with self-made mouthpiece.  Lower leg hematoma Hematoma on right lower leg following trauma. No signs of infection. - Ordered ultrasound of right lower leg.    No orders of the defined types were placed in this encounter.   No follow-ups on file.   The patient was advised to call back or seek an in-person evaluation if the symptoms worsen or if the condition fails to improve as anticipated.  I discussed the assessment and treatment plan with the patient. The patient was provided an opportunity to ask questions and all were answered. The  patient agreed with the plan and demonstrated an understanding of the instructions.  I, Darchelle Nunes, PA-C have reviewed all documentation for this visit. The documentation on 10/23/2024  for the exam, diagnosis, procedures, and orders are all accurate and complete.  Jolynn Spencer, [facility], MMS [facility] [phone] (phone) [phone] (fax)  [facility]     [1]  Outpatient Medications Prior to Visit  Medication Sig   tamsulosin  (FLOMAX ) 0.4 MG CAPS capsule Take 2 capsules (0.8 mg total) by mouth daily.   amLODipine  (NORVASC ) 5 MG tablet Take 1 tablet (5 mg total) by mouth daily. (Patient not taking: Reported on 10/23/2024)   atorvastatin  (LIPITOR) 20 MG tablet TAKE 1 TABLET(20 MG) BY MOUTH DAILY (Patient not taking: Reported on 10/23/2024)   Cholecalciferol (VITAMIN D3) 125 MCG (5000 UT) CAPS Take 1 capsule (5,000 Units total) by mouth daily.   No facility-administered medications prior to visit.   "

## 2024-12-02 ENCOUNTER — Ambulatory Visit: Payer: Self-pay
